# Patient Record
Sex: Male | Born: 1960 | Race: Black or African American | Hispanic: No | Marital: Single | Smoking: Current some day smoker
Health system: Southern US, Community
[De-identification: ages and names within clinical notes are randomized; demographics above are authoritative.]

## PROBLEM LIST (undated history)

## (undated) DIAGNOSIS — F1010 Alcohol abuse, uncomplicated: Secondary | ICD-10-CM

## (undated) DIAGNOSIS — I10 Essential (primary) hypertension: Secondary | ICD-10-CM

## (undated) DIAGNOSIS — H9190 Unspecified hearing loss, unspecified ear: Secondary | ICD-10-CM

## (undated) HISTORY — PX: ABDOMINAL SURGERY: SHX537

---

## 2008-04-27 ENCOUNTER — Emergency Department: Payer: Self-pay | Admitting: Unknown Physician Specialty

## 2010-08-01 ENCOUNTER — Emergency Department: Payer: Self-pay | Admitting: Emergency Medicine

## 2012-06-08 ENCOUNTER — Emergency Department: Payer: Self-pay | Admitting: Emergency Medicine

## 2012-06-09 LAB — COMPREHENSIVE METABOLIC PANEL
Alkaline Phosphatase: 139 U/L — ABNORMAL HIGH (ref 50–136)
Calcium, Total: 9.4 mg/dL (ref 8.5–10.1)
Chloride: 103 mmol/L (ref 98–107)
Co2: 28 mmol/L (ref 21–32)
Creatinine: 0.71 mg/dL (ref 0.60–1.30)
EGFR (Non-African Amer.): >60
Osmolality: 281 (ref 275–301)
Sodium: 141 mmol/L (ref 136–145)
Total Protein: 8.8 g/dL — ABNORMAL HIGH (ref 6.4–8.2)

## 2012-06-09 LAB — CBC
HCT: 45.7 % (ref 40.0–52.0)
HGB: 15.3 g/dL (ref 13.0–18.0)
MCH: 29.6 pg (ref 26.0–34.0)
MCV: 88 fL (ref 80–100)
RBC: 5.17 10*6/uL (ref 4.40–5.90)

## 2012-06-09 LAB — DRUG SCREEN, URINE
Amphetamines, Ur Screen: NEGATIVE (ref ?–1000)
Barbiturates, Ur Screen: NEGATIVE (ref ?–200)
Cannabinoid 50 Ng, Ur ~~LOC~~: POSITIVE (ref ?–50)
MDMA (Ecstasy)Ur Screen: NEGATIVE (ref ?–500)
Opiate, Ur Screen: NEGATIVE (ref ?–300)
Phencyclidine (PCP) Ur S: NEGATIVE (ref ?–25)
Tricyclic, Ur Screen: NEGATIVE (ref ?–1000)

## 2012-06-09 LAB — ETHANOL
Ethanol %: <0.003 % (ref 0.000–0.080)
Ethanol: <3 mg/dL

## 2012-06-09 LAB — TROPONIN I: Troponin-I: <0.02 ng/mL

## 2012-08-29 ENCOUNTER — Emergency Department: Payer: Self-pay | Admitting: Emergency Medicine

## 2014-07-28 ENCOUNTER — Emergency Department: Payer: Self-pay | Admitting: Emergency Medicine

## 2016-01-04 ENCOUNTER — Emergency Department
Admission: EM | Admit: 2016-01-04 | Discharge: 2016-01-04 | Disposition: A | Discharge status: Home / Self Care | Payer: Medicaid Other | Attending: Emergency Medicine | Admitting: Emergency Medicine

## 2016-01-04 DIAGNOSIS — Y9289 Other specified places as the place of occurrence of the external cause: Secondary | ICD-10-CM | POA: Diagnosis not present

## 2016-01-04 DIAGNOSIS — Z23 Encounter for immunization: Secondary | ICD-10-CM | POA: Insufficient documentation

## 2016-01-04 DIAGNOSIS — X58XXXA Exposure to other specified factors, initial encounter: Secondary | ICD-10-CM | POA: Insufficient documentation

## 2016-01-04 DIAGNOSIS — Y9389 Activity, other specified: Secondary | ICD-10-CM | POA: Insufficient documentation

## 2016-01-04 DIAGNOSIS — Y998 Other external cause status: Secondary | ICD-10-CM | POA: Diagnosis not present

## 2016-01-04 DIAGNOSIS — S61219A Laceration without foreign body of unspecified finger without damage to nail, initial encounter: Secondary | ICD-10-CM

## 2016-01-04 DIAGNOSIS — S61215A Laceration without foreign body of left ring finger without damage to nail, initial encounter: Secondary | ICD-10-CM | POA: Insufficient documentation

## 2016-01-04 MED ORDER — TETANUS-DIPHTH-ACELL PERTUSSIS 5-2.5-18.5 LF-MCG/0.5 IM SUSP
0.5000 mL | Freq: Once | INTRAMUSCULAR | Status: AC
  Administered 2016-01-04: 0.5 mL via INTRAMUSCULAR
  Filled 2016-01-04: qty 0.5

## 2016-01-04 MED ORDER — AMOXICILLIN-POT CLAVULANATE 875-125 MG PO TABS
1.0000 | ORAL_TABLET | Freq: Once | ORAL | Status: AC
  Administered 2016-01-04: 1 via ORAL
  Filled 2016-01-04: qty 1

## 2016-01-04 MED ORDER — AMOXICILLIN-POT CLAVULANATE 875-125 MG PO TABS: 1.0000 | ORAL_TABLET | Freq: Two times a day (BID) | ORAL | Status: DC

## 2016-01-04 MED ORDER — LIDOCAINE HCL (PF) 1 % IJ SOLN
5.0000 mL | Freq: Once | INTRAMUSCULAR | Status: AC
  Administered 2016-01-04: 5 mL
  Filled 2016-01-04: qty 5

## 2016-01-04 NOTE — Discharge Instructions (Signed)
Nonsutured Laceration Care °A laceration is a cut that goes through all layers of the skin and extends into the tissue that is right under the skin. This type of cut is usually stitched up (sutured) or closed with tape (adhesive strips) or skin glue shortly after the injury happens. °However, if the wound is dirty or if several hours pass before medical treatment is provided, it is likely that germs (bacteria) will enter the wound. Closing a laceration after bacteria have entered it increases the risk of infection. In these cases, your health care provider may leave the laceration open (nonsutured) and cover it with a bandage. This type of treatment helps prevent infection and allows the wound to heal from the deepest layer of tissue damage up to the surface. °An open fracture is a type of injury that may involve nonsutured lacerations. An open fracture is a break in a bone that happens along with one or more lacerations through the skin that is near the fracture site. °HOW TO CARE FOR YOUR NONSUTURED LACERATION °· Take or apply over-the-counter and prescription medicines only as told by your health care provider. °· If you were prescribed an antibiotic medicine, take or apply it as told by your health care provider. Do not stop using the antibiotic even if your condition improves. °· Clean the wound one time each day or as told by your health care provider. °¨ Wash the wound with mild soap and water. °¨ Rinse the wound with water to remove all soap. °¨ Pat your wound dry with a clean towel. Do not rub the wound. °· Do not inject anything into the wound unless your health care provider told you to. °· Change any bandages (dressings) as told by your health care provider. This includes changing the dressing if it gets wet, dirty, or starts to smell bad. °· Keep the dressing dry until your health care provider says it can be removed. Do not take baths, swim, or do anything that puts your wound underwater until your  health care provider approves. °· Raise (elevate) the injured area above the level of your heart while you are sitting or lying down, if possible. °· Do not scratch or pick at the wound. °· Check your wound every day for signs of infection. Watch for: °¨ Redness, swelling, or pain. °¨ Fluid, blood, or pus. °· Keep all follow-up visits as told by your health care provider. This is important. °SEEK MEDICAL CARE IF: °· You received a tetanus and shot and you have swelling, severe pain, redness, or bleeding at the injection site.   °· You have a fever. °· Your pain is not controlled with medicine. °· You have increased redness, swelling, or pain at the site of your wound. °· You have fluid, blood, or pus coming from your wound. °· You notice a bad smell coming from your wound or your dressing. °· You notice something coming out of the wound, such as wood or glass. °· You notice a change in the color of your skin near your wound. °· You develop a new rash. °· You need to change the dressing frequently due to fluid, blood, or pus draining from the wound. °· You develop numbness around your wound. °SEEK IMMEDIATE MEDICAL CARE IF: °· Your pain suddenly increases and is severe. °· You develop severe swelling around the wound. °· The wound is on your hand or foot and you cannot properly move a finger or toe. °· The wound is on your hand or   foot and you notice that your fingers or toes look pale or bluish.  You have a red streak going away from your wound.   This information is not intended to replace advice given to you by your health care provider. Make sure you discuss any questions you have with your health care provider.   Document Released: 09/18/2006 Document Revised: 03/07/2015 Document Reviewed: 10/17/2014 Elsevier Interactive Patient Education [employer]   Since your finger laceration happened more than 15 hours ago, it can not be safely fixed with sutures. You should take the antibiotic until gone  to prevent infection. Keep the wound clean, dry, and covered. Do not remove the tape until the wound bed is dry. You should re-apply tape to the wound as needed. Return here for wound check in a 4-7 days.

## 2016-01-04 NOTE — ED Notes (Signed)
Pt is hearing impaired and requires sign language.. Pt recently had suture to the left 4th finger and they have come out and the wound is open.. No s/sx of infection

## 2016-01-04 NOTE — ED Provider Notes (Signed)
[facility] Emergency Department Provider Note ____________________________________________  Time seen: 1227  I have reviewed the triage vital signs and the nursing notes.  HISTORY  Chief Complaint  Laceration  History limited by hearing impairment. Tele-interpreter (ASL) is used for interview and exam.   HPI Cameron Hunt  is a 55 y.o. male is into the ED for evaluation of a laceration to his left ring finger at the fat pad.Through the interpreter, the patient describes injury occurred sometime yesterday evening between the hours of 5 in the evening and midnight. He is on aware, and unable to give the exact cause of the injury. He describes that at about 5:00 he either passed out or fell asleep, and awoke noting pain to the finger. He denies any other injury at this time, and presents for treatment of this open wound to the distal fingertip. The patient has a laceration to the fat pad and the cuticle that he cannot explain. He is primarily concerned for wound dressing and keep in the finger protected until it heals. Patient is advised that due to the time since the injury, and the unclear etiology, this wound will likely not be sutured here in the ED.  No past medical history on file.  There are no active problems to display for this patient.  No past surgical history on file.  Current Outpatient Rx  Name  Route  Sig  Dispense  Refill  . amoxicillin-clavulanate (AUGMENTIN) 875-125 MG tablet   Oral   Take 1 tablet by mouth 2 (two) times daily.   19 tablet   0    Allergies Review of patient's allergies indicates not on file.  No family history on file.  Social History Social History  Substance Use Topics  . Smoking status: Not on file  . Smokeless tobacco: Not on file  . Alcohol Use: Not on file   Review of Systems  Constitutional: Negative for fever. Eyes: Negative for visual changes. ENT: Negative for sore throat. Cardiovascular: Negative for  chest pain. Respiratory: Negative for shortness of breath. Musculoskeletal: Negative for back pain. Skin: Negative for rash. Left ring finger lac as above. Neurological: Negative for headaches, focal weakness or numbness. ____________________________________________  PHYSICAL EXAM:  VITAL SIGNS: ED Triage Vitals  Enc Vitals Group     BP 01/04/16 1150 112/86 mmHg     Pulse Rate 01/04/16 1150 80     Resp 01/04/16 1150 18     Temp 01/04/16 1150 97.7 F (36.5 C)     Temp Source 01/04/16 1150 Oral     SpO2 01/04/16 1150 100 %     Weight --      Height --      Head Cir --      Peak Flow --      Pain Score --      Pain Loc --      Pain Edu? --      Excl. in GC? --    Constitutional: Alert and oriented. Well appearing and in no distress. Head: Normocephalic and atraumatic. Eyes: Conjunctivae are normal. PERRL. Normal extraocular movements Hematological/Lymphatic/Immunological: No cervical lymphadenopathy. Cardiovascular: Normal rate, regular rhythm.  Respiratory: Normal respiratory effort.  Musculoskeletal: Nontender with normal range of motion in all extremities.  Neurologic:  Normal gait without ataxia. Normal speech and language. No gross focal neurologic deficits are appreciated. Skin:  Skin is warm, dry and intact. No rash noted. Psychiatric: Mood and affect are normal. Patient exhibits appropriate insight and judgment. ____________________________________________  PROCEDURES  Augmentin 875 mg PO Tdap Finger splint   LACERATION REPAIR Performed by: Lissa Hoard Authorized by: Lissa Hoard Consent: Verbal consent obtained. Risks and benefits: risks, benefits and alternatives were discussed Consent given by: patient Patient identity confirmed: provided demographic data Prepped and Draped in normal sterile fashion Wound explored  Laceration Location: left ring finger  Laceration Length: 2 cm  No Foreign Bodies seen or palpated  Anesthesia:  digital infiltration  Local anesthetic: lidocaine 1% w/o epinephrine  Anesthetic total: 3 ml  Irrigation method: syringe Amount of cleaning: standard  Skin closure: steri-strips  Number of sutures: 4  Patient tolerance: Patient tolerated the procedure well with no immediate complications. ____________________________________________  INITIAL IMPRESSION / ASSESSMENT AND PLAN / ED COURSE  Patient with a old left finger laceration of unknown mechanism. The wound will heal by secondary intent. Patient will be discharged with wound care supplies for daily wound care. He will be supplied with a finger splint for protection. Return to the ED for wound check as needed.  ____________________________________________  FINAL CLINICAL IMPRESSION(S) / ED DIAGNOSES  Final diagnoses:  Finger laceration, initial encounter      Lissa Hoard, PA-C 01/04/16 1635  Sharman Cheek, MD 01/06/16 1555

## 2016-01-04 NOTE — ED Notes (Signed)
Pt with lac finger #4 left hand at last joint. Not bleeding at this time.

## 2016-01-04 NOTE — ED Notes (Addendum)
Call Deirdre Peer the pt neighbor when the pt is ready for discharge at [phone]

## 2017-03-09 ENCOUNTER — Encounter: Payer: Self-pay | Admitting: Emergency Medicine

## 2017-03-09 ENCOUNTER — Emergency Department
Admission: EM | Admit: 2017-03-09 | Discharge: 2017-03-09 | Disposition: A | Discharge status: Home / Self Care | Payer: Medicaid Other | Attending: Emergency Medicine | Admitting: Emergency Medicine

## 2017-03-09 DIAGNOSIS — Z79899 Other long term (current) drug therapy: Secondary | ICD-10-CM | POA: Diagnosis not present

## 2017-03-09 DIAGNOSIS — F10920 Alcohol use, unspecified with intoxication, uncomplicated: Secondary | ICD-10-CM

## 2017-03-09 DIAGNOSIS — F10120 Alcohol abuse with intoxication, uncomplicated: Secondary | ICD-10-CM | POA: Insufficient documentation

## 2017-03-09 DIAGNOSIS — F1910 Other psychoactive substance abuse, uncomplicated: Secondary | ICD-10-CM | POA: Insufficient documentation

## 2017-03-09 LAB — COMPREHENSIVE METABOLIC PANEL
ALBUMIN: 4.7 g/dL (ref 3.5–5.0)
ALK PHOS: 94 U/L (ref 38–126)
ALT: 18 U/L (ref 17–63)
ANION GAP: 10 (ref 5–15)
AST: 35 U/L (ref 15–41)
BUN: 10 mg/dL (ref 6–20)
CHLORIDE: 106 mmol/L (ref 101–111)
CO2: 26 mmol/L (ref 22–32)
Calcium: 8.6 mg/dL — ABNORMAL LOW (ref 8.9–10.3)
Creatinine, Ser: 0.67 mg/dL (ref 0.61–1.24)
GFR calc non Af Amer: >60 mL/min (ref >60)
GLUCOSE: 93 mg/dL (ref 65–99)
Potassium: 3.6 mmol/L (ref 3.5–5.1)
SODIUM: 142 mmol/L (ref 135–145)
Total Bilirubin: 0.6 mg/dL (ref 0.3–1.2)
Total Protein: 8.4 g/dL — ABNORMAL HIGH (ref 6.5–8.1)

## 2017-03-09 LAB — CBC
HCT: 37.5 % — ABNORMAL LOW (ref 40.0–52.0)
HEMOGLOBIN: 13.2 g/dL (ref 13.0–18.0)
MCH: 31.6 pg (ref 26.0–34.0)
MCHC: 35.2 g/dL (ref 32.0–36.0)
MCV: 89.8 fL (ref 80.0–100.0)
PLATELETS: 326 10*3/uL (ref 150–440)
RBC: 4.17 MIL/uL — AB (ref 4.40–5.90)
RDW: 15 % — ABNORMAL HIGH (ref 11.5–14.5)
WBC: 12.6 10*3/uL — ABNORMAL HIGH (ref 3.8–10.6)

## 2017-03-09 LAB — URINE DRUG SCREEN, QUALITATIVE (ARMC ONLY)
AMPHETAMINES, UR SCREEN: NOT DETECTED
Barbiturates, Ur Screen: NOT DETECTED
Benzodiazepine, Ur Scrn: NOT DETECTED
COCAINE METABOLITE, UR ~~LOC~~: NOT DETECTED
Cannabinoid 50 Ng, Ur ~~LOC~~: POSITIVE — AB
MDMA (ECSTASY) UR SCREEN: NOT DETECTED
METHADONE SCREEN, URINE: NOT DETECTED
OPIATE, UR SCREEN: NOT DETECTED
Phencyclidine (PCP) Ur S: NOT DETECTED
TRICYCLIC, UR SCREEN: NOT DETECTED

## 2017-03-09 LAB — ETHANOL: Alcohol, Ethyl (B): 320 mg/dL (ref <5)

## 2017-03-09 LAB — ACETAMINOPHEN LEVEL: Acetaminophen (Tylenol), Serum: <10 ug/mL — ABNORMAL LOW (ref 10–30)

## 2017-03-09 LAB — SALICYLATE LEVEL: Salicylate Lvl: <7 mg/dL (ref 2.8–30.0)

## 2017-03-09 NOTE — ED Triage Notes (Signed)
Patient presents to Emergency Department via [facility] with complaints of being found down by bystanders.  Per EMS pt is drunk, peed on self when they picked him up, pt is deaf and mainly mute, (occasional grunts and moans).  Pt doesn't appear to know ASL.  Pt communicating mostly through gestures and grunts.  Appears to be requesting food and warmth.  Pt smells of ETOH and urine.

## 2017-03-09 NOTE — ED Notes (Addendum)
ASL used by Dr Zenda AlpersWebster, interpreter and this RN  Pt reports via interpreter: no problems, [place] the [place], drank 2 x 40oz, "smoked a little weed", take no meds and isn't supposed to be taking any, denies allergies to meds, DM and HTN, goes to church and lives with ex-wife and daughter  Pt unable to give name or phone number of anyone to pick him up  Pt given warm blanket and food and drink

## 2017-03-09 NOTE — ED Notes (Signed)
Meal tray provided to pt. NAD at this time.

## 2017-03-09 NOTE — ED Notes (Signed)
Pt communicates with this RN by writing and denies any problems. Pt reports he has been drinking tonight but does not wish to hurt himself or anyone else. Will have pt interviewed by [employer] Interpreter for [employer].

## 2017-03-09 NOTE — ED Provider Notes (Signed)
-----------------------------------------   10:06 AM on 03/09/2017 -----------------------------------------  Patient appears well, is awake, has eaten without difficulty. Is currently sitting in the hallway, no distress reading a newspaper. Patient appears clinically sober. We will discharge the patient home.   [name], Elbia Paro, MD 03/09/17 1006

## 2017-03-09 NOTE — ED Provider Notes (Signed)
[facility] Emergency Department Provider Note   ____________________________________________   First MD Initiated Contact with Patient 03/09/17 0107     (approximate)  I have reviewed the triage vital signs and the nursing notes.   HISTORY  Chief Complaint Alcohol Intoxication    HPI Cameron Hunt is a 56 y.o. male who comes into the hospital today after being [place] laying outside on the track by EMS. They report that he seems intoxicated. The patient is hearing impaired. When they arrived he was unresponsive but he is aroused now. Using a sign language interpreter we discovered that the patient denies falling or hitting his head. He reports that he was just really tired which is why he laid down.He reports that he was just drinking some tonight. The patient states that he lives with his wife and child. He also reports that they are lying but will not state what they are. The patient denies any suicidal or homicidal ideation. He also denies any significant pain or injury. The patient reports that he has back pain but that is something that is chronic and tends to come and go. The patient was brought into the hospital tonight for evaluation.   History reviewed. No pertinent past medical history.  There are no active problems to display for this patient.   History reviewed. No pertinent surgical history.  Prior to Admission medications   Medication Sig Start Date End Date Taking? Authorizing Provider  amoxicillin-clavulanate (AUGMENTIN) 875-125 MG tablet Take 1 tablet by mouth 2 (two) times daily. 01/04/16   Menshew, Charlesetta Ivory, PA-C    Allergies Patient has no known allergies.  History reviewed. No pertinent family history.  Social History Social History  Substance Use Topics  . Smoking status: Unknown If Ever Smoked  . Smokeless tobacco: Not on file  . Alcohol use Yes    Review of Systems  Constitutional: No fever/chills Eyes: No visual  changes. ENT: No sore throat. Cardiovascular: Denies chest pain. Respiratory: Denies shortness of breath. Gastrointestinal: No abdominal pain.  No nausea, no vomiting.  No diarrhea.  No constipation. Genitourinary: Negative for dysuria. Musculoskeletal:  back pain. Skin: Negative for rash. Neurological: Negative for headaches, focal weakness or numbness.   ____________________________________________   PHYSICAL EXAM:  VITAL SIGNS: ED Triage Vitals  Enc Vitals Group     BP 03/09/17 0209 129/89     Pulse Rate 03/09/17 0209 83     Resp 03/09/17 0209 18     Temp 03/09/17 0209 97.8 F (36.6 C)     Temp Source 03/09/17 0209 Oral     SpO2 03/09/17 0209 100 %     Weight 03/09/17 0210 150 lb (68 kg)     Height 03/09/17 0210 6\' 1"  (1.854 m)     Head Circumference --      Peak Flow --      Pain Score --      Pain Loc --      Pain Edu? --      Excl. in GC? --     Constitutional: Alert and oriented. Well appearing and in no acute distress. Eyes: Conjunctivae are normal. PERRL. EOMI. Head: Atraumatic. Nose: No congestion/rhinnorhea. Mouth/Throat: Mucous membranes are moist.  Oropharynx non-erythematous. Neck: No cervical spine tenderness to palpation. Cardiovascular: Normal rate, regular rhythm. Grossly normal heart sounds.  Good peripheral circulation. Respiratory: Normal respiratory effort.  No retractions. Lungs CTAB. Gastrointestinal: Soft and nontender. No distention. Positive bowel sounds Musculoskeletal: No lower extremity tenderness nor  edema.   Neurologic:  Normal speech and language.  Skin:  Skin is warm, dry and intact. Marland Kitchen. Psychiatric: Mood and affect are normal.   ____________________________________________   LABS (all labs ordered are listed, but only abnormal results are displayed)  Labs Reviewed  CBC - Abnormal; Notable for the following:       Result Value   WBC 12.6 (*)    RBC 4.17 (*)    HCT 37.5 (*)    RDW 15.0 (*)    All other components within  normal limits  COMPREHENSIVE METABOLIC PANEL - Abnormal; Notable for the following:    Calcium 8.6 (*)    Total Protein 8.4 (*)    All other components within normal limits  ETHANOL - Abnormal; Notable for the following:    Alcohol, Ethyl (B) 320 (*)    All other components within normal limits  URINE DRUG SCREEN, QUALITATIVE ([facility] ONLY) - Abnormal; Notable for the following:    Cannabinoid 50 Ng, Ur [place] POSITIVE (*)    All other components within normal limits  ACETAMINOPHEN LEVEL - Abnormal; Notable for the following:    Acetaminophen (Tylenol), Serum <10 (*)    All other components within normal limits  SALICYLATE LEVEL   ____________________________________________  EKG  none ____________________________________________  RADIOLOGY  none ____________________________________________   PROCEDURES  Procedure(s) performed: None  Procedures  Critical Care performed: No  ____________________________________________   INITIAL IMPRESSION / ASSESSMENT AND PLAN / ED COURSE  Pertinent labs & imaging results that were available during my care of the patient were reviewed by me and considered in my medical decision making (see chart for details).  This is a 56 year old male who comes into the hospital today after being found unresponsive on a truck. The patient is acutely intoxicated. His alcohol level is 320. The patient also has been smoking marijuana. We will monitor the patient until he sobers up.    The patient's care will be signed out to Dr.Paduchowski who will disposition the patient  ____________________________________________   FINAL CLINICAL IMPRESSION(S) / ED DIAGNOSES  Final diagnoses:  Alcoholic intoxication without complication (HCC)  Substance abuse      NEW MEDICATIONS STARTED DURING THIS VISIT:  New Prescriptions   No medications on file     Note:  This document was prepared using Dragon voice recognition software and may include  unintentional dictation errors.    Rebecka ApleyWebster, Allison P, MD 03/09/17 [phone]

## 2017-03-09 NOTE — ED Notes (Signed)

## 2017-03-09 NOTE — ED Notes (Signed)
BEHAVIORAL HEALTH ROUNDING Patient sleeping: Yes.   Patient alert and oriented: not applicable SLEEPING Behavior appropriate: Yes.  ; If no, describe: SLEEPING Nutrition and fluids offered: No SLEEPING Toileting and hygiene offered: NoSLEEPING Sitter present: not applicable, Q 15 min safety rounds and observation. Law enforcement present: Yes ODS 

## 2017-03-09 NOTE — ED Notes (Signed)
CRITICAL LAB: ETOH is 320, [facility], Dr. Zenda AlpersWebster notified, orders received

## 2017-03-09 NOTE — ED Notes (Signed)
This RN took pt to [employer] [facility] shower, soap, water and deodorant used, pt very unsteady, pt appears appreciative, linen changed, new clothes (burgundy scrubs) given, pt returned to hallway for triage, urine soaked clothes in pt's belongings bag

## 2017-03-09 NOTE — ED Notes (Signed)
BEHAVIORAL HEALTH ROUNDING  Patient sleeping: No.  Patient alert and oriented: yes  Behavior appropriate: Yes. ; If no, describe:  Nutrition and fluids offered: Yes  Toileting and hygiene offered: Yes  Sitter present: not applicable, Q 15 min safety rounds and observation.  Law enforcement present: Yes ODS  

## 2017-05-20 ENCOUNTER — Emergency Department: Payer: Medicaid Other

## 2017-05-20 ENCOUNTER — Encounter: Payer: Self-pay | Admitting: Emergency Medicine

## 2017-05-20 ENCOUNTER — Emergency Department
Admission: EM | Admit: 2017-05-20 | Discharge: 2017-05-20 | Disposition: A | Discharge status: Home / Self Care | Payer: Medicaid Other | Attending: Emergency Medicine | Admitting: Emergency Medicine

## 2017-05-20 DIAGNOSIS — G5691 Unspecified mononeuropathy of right upper limb: Secondary | ICD-10-CM | POA: Insufficient documentation

## 2017-05-20 DIAGNOSIS — F17210 Nicotine dependence, cigarettes, uncomplicated: Secondary | ICD-10-CM | POA: Insufficient documentation

## 2017-05-20 DIAGNOSIS — M79641 Pain in right hand: Secondary | ICD-10-CM | POA: Diagnosis present

## 2017-05-20 MED ORDER — METHYLPREDNISOLONE 4 MG PO TBPK: ORAL_TABLET | ORAL | 0 refills | Status: DC

## 2017-05-20 NOTE — ED Provider Notes (Signed)
[facility] Emergency Department Provider Note   ____________________________________________   First MD Initiated Contact with Patient 05/20/17 1242     (approximate)  I have reviewed the triage vital signs and the nursing notes.   HISTORY  Chief Complaint Hand Pain    HPI Cameron Hunt is a 56 y.o. male patient complain of right hand pain for 2-3 weeks. Patient unsure of injury. Patient states pain starts at the wrist and migrates to the hand. Patient is right-hand dominant. Denies loss of sensation.Patient rates pain as 7/10. Patient described a pain as "dull". No palliative measures for complaint.  History reviewed. No pertinent past medical history.  There are no active problems to display for this patient.   History reviewed. No pertinent surgical history.  Prior to Admission medications   Medication Sig Start Date End Date Taking? Authorizing Provider  amoxicillin-clavulanate (AUGMENTIN) 875-125 MG tablet Take 1 tablet by mouth 2 (two) times daily. Patient not taking: Reported on 03/09/2017 01/04/16   Menshew, Charlesetta Ivory, PA-C  methylPREDNISolone (MEDROL DOSEPAK) 4 MG TBPK tablet Take Tapered dose as directed 05/20/17   Joni Reining, PA-C    Allergies Patient has no known allergies.  No family history on file.  Social History Social History  Substance Use Topics  . Smoking status: Current Some Day Smoker  . Smokeless tobacco: Never Used  . Alcohol use Yes    Review of Systems Constitutional: No fever/chills Eyes: No visual changes. ENT: No sore throat. Hearing loss Cardiovascular: Denies chest pain. Respiratory: Denies shortness of breath. Gastrointestinal: No abdominal pain.  No nausea, no vomiting.  No diarrhea.  No constipation. Genitourinary: Negative for dysuria. Musculoskeletal: Negative for back pain. Skin: Negative for rash. Neurological: Negative for headaches, focal weakness or numbness. Psychiatric:EtOH  abuse ____________________________________________   PHYSICAL EXAM:  VITAL SIGNS: ED Triage Vitals  Enc Vitals Group     BP 05/20/17 1237 133/89     Pulse Rate 05/20/17 1237 85     Resp 05/20/17 1237 20     Temp 05/20/17 1237 98.2 F (36.8 C)     Temp Source 05/20/17 1237 Oral     SpO2 05/20/17 1237 100 %     Weight 05/20/17 1237 139 lb (63 kg)     Height 05/20/17 1237 6' (1.829 m)     Head Circumference --      Peak Flow --      Pain Score 05/20/17 1236 7     Pain Loc --      Pain Edu? --      Excl. in GC? --     Constitutional: Alert and oriented. Well appearing and in no acute distress. Cardiovascular: Normal rate, regular rhythm. Grossly normal heart sounds.  Good peripheral circulation. Respiratory: Normal respiratory effort.  No retractions. Lungs CTAB. Gastrointestinal: Soft and nontender. No distention. No abdominal bruits. No CVA tenderness. Musculoskeletal:No obvious deformity edema or erythema to the right hand. Patient has no guarding palpation of the wrist and hand. Patient has full nuchal range of motion. Patient grip strength is a 3/5 compared to left nondominant extremity.  Neurologic:  Normal speech and language. No gross focal neurologic deficits are appreciated. No gait instability. Skin:  Skin is warm, dry and intact. No rash noted. Psychiatric: Mood and affect are normal. Speech and behavior are normal.  ____________________________________________   LABS (all labs ordered are listed, but only abnormal results are displayed)  Labs Reviewed - No data to display ____________________________________________  EKG  ____________________________________________  RADIOLOGY  Dg Hand Complete Right  Result Date: 05/20/2017 CLINICAL DATA:  RIGHT hand pain for 3 weeks, uncertain injury, dorsal pain, weakened grip EXAM: RIGHT HAND - COMPLETE 3+ VIEW COMPARISON:  None FINDINGS: Osseous mineralization normal. Joint spaces preserved. Small non fused ossicle  at dorsal margin of the radiocarpal joint, appears corticated and old. No fracture, dislocation, or bone destruction. IMPRESSION: No acute abnormalities. Electronically Signed   By: Ulyses SouthwardMark  Boles M.D.   On: 05/20/2017 12:58    ____________________________________________   PROCEDURES  Procedure(s) performed: None  Procedures  Critical Care performed: No  ____________________________________________   INITIAL IMPRESSION / ASSESSMENT AND PLAN / ED COURSE  Pertinent labs & imaging results that were available during my care of the patient were reviewed by me and considered in my medical decision making (see chart for details).  Neuropathy of the right hand. Discussed x-ray finding with patient. Patient placed in a Velcro wrist splint. [facility] to [facility]y.      ____________________________________________   FINAL CLINICAL IMPRESSION(S) / ED DIAGNOSES  Final diagnoses:  Neuropathy of right hand      NEW MEDICATIONS STARTED DURING THIS VISIT:  New Prescriptions   METHYLPREDNISOLONE (MEDROL DOSEPAK) 4 MG TBPK TABLET    Take Tapered dose as directed     Note:  This document was prepared using Dragon voice recognition software and may include unintentional dictation errors.    Joni ReiningSmith, Jem Castro K, PA-C 05/20/17 1322    Rockne MenghiniNorman, Anne-Caroline, MD 05/20/17 [phone]

## 2017-05-20 NOTE — Discharge Instructions (Signed)
Wear splint until evaluation by a neurologist

## 2017-05-20 NOTE — ED Triage Notes (Signed)
Presents with pain to right hand for about 3 weeks  Unsure of injury states pain is mainly at wrist and moves into hand  Right hand grip is weaker that the right  Positive pulses

## 2018-04-10 ENCOUNTER — Emergency Department: Payer: Medicaid Other

## 2018-04-10 ENCOUNTER — Encounter: Payer: Self-pay | Admitting: Emergency Medicine

## 2018-04-10 DIAGNOSIS — Z5321 Procedure and treatment not carried out due to patient leaving prior to being seen by health care provider: Secondary | ICD-10-CM | POA: Diagnosis not present

## 2018-04-10 DIAGNOSIS — Y929 Unspecified place or not applicable: Secondary | ICD-10-CM | POA: Diagnosis not present

## 2018-04-10 DIAGNOSIS — Y998 Other external cause status: Secondary | ICD-10-CM | POA: Diagnosis not present

## 2018-04-10 DIAGNOSIS — W0110XA Fall on same level from slipping, tripping and stumbling with subsequent striking against unspecified object, initial encounter: Secondary | ICD-10-CM | POA: Diagnosis not present

## 2018-04-10 DIAGNOSIS — S0990XA Unspecified injury of head, initial encounter: Secondary | ICD-10-CM | POA: Insufficient documentation

## 2018-04-10 DIAGNOSIS — Y939 Activity, unspecified: Secondary | ICD-10-CM | POA: Diagnosis not present

## 2018-04-10 NOTE — ED Triage Notes (Signed)
Patient tripped and fell. Patient hit his head. Patient with abrasions to forehead. Patient +LOC. Patient denies any other injury.

## 2018-04-11 ENCOUNTER — Emergency Department
Admission: EM | Admit: 2018-04-11 | Discharge: 2018-04-11 | Disposition: A | Discharge status: Home / Self Care | Payer: Medicaid Other | Attending: Emergency Medicine | Admitting: Emergency Medicine

## 2018-04-11 HISTORY — DX: Essential (primary) hypertension: I10

## 2018-04-13 ENCOUNTER — Telehealth: Payer: Self-pay | Admitting: Emergency Medicine

## 2018-04-13 NOTE — Telephone Encounter (Signed)
Called patient due to lwot to inquire about condition and follow up plans. Left message with person who answered.  She says he is not around right now.

## 2018-05-25 ENCOUNTER — Other Ambulatory Visit: Payer: Self-pay

## 2018-05-25 ENCOUNTER — Emergency Department
Admission: EM | Admit: 2018-05-25 | Discharge: 2018-05-26 | Disposition: A | Discharge status: Home / Self Care | Payer: Medicaid Other | Attending: Emergency Medicine | Admitting: Emergency Medicine

## 2018-05-25 DIAGNOSIS — F1010 Alcohol abuse, uncomplicated: Secondary | ICD-10-CM

## 2018-05-25 DIAGNOSIS — F17200 Nicotine dependence, unspecified, uncomplicated: Secondary | ICD-10-CM | POA: Insufficient documentation

## 2018-05-25 DIAGNOSIS — Y939 Activity, unspecified: Secondary | ICD-10-CM | POA: Insufficient documentation

## 2018-05-25 DIAGNOSIS — S0081XA Abrasion of other part of head, initial encounter: Secondary | ICD-10-CM | POA: Insufficient documentation

## 2018-05-25 DIAGNOSIS — I10 Essential (primary) hypertension: Secondary | ICD-10-CM | POA: Insufficient documentation

## 2018-05-25 DIAGNOSIS — F10950 Alcohol use, unspecified with alcohol-induced psychotic disorder with delusions: Secondary | ICD-10-CM | POA: Diagnosis not present

## 2018-05-25 DIAGNOSIS — W010XXA Fall on same level from slipping, tripping and stumbling without subsequent striking against object, initial encounter: Secondary | ICD-10-CM | POA: Insufficient documentation

## 2018-05-25 DIAGNOSIS — Y999 Unspecified external cause status: Secondary | ICD-10-CM | POA: Insufficient documentation

## 2018-05-25 DIAGNOSIS — Y929 Unspecified place or not applicable: Secondary | ICD-10-CM | POA: Diagnosis not present

## 2018-05-25 DIAGNOSIS — F10920 Alcohol use, unspecified with intoxication, uncomplicated: Secondary | ICD-10-CM

## 2018-05-25 DIAGNOSIS — F10220 Alcohol dependence with intoxication, uncomplicated: Secondary | ICD-10-CM | POA: Insufficient documentation

## 2018-05-25 DIAGNOSIS — H9190 Unspecified hearing loss, unspecified ear: Secondary | ICD-10-CM

## 2018-05-25 DIAGNOSIS — F10959 Alcohol use, unspecified with alcohol-induced psychotic disorder, unspecified: Secondary | ICD-10-CM

## 2018-05-25 HISTORY — DX: Unspecified hearing loss, unspecified ear: H91.90

## 2018-05-25 LAB — ACETAMINOPHEN LEVEL

## 2018-05-25 LAB — COMPREHENSIVE METABOLIC PANEL
ALK PHOS: 95 U/L (ref 38–126)
ALT: 26 U/L (ref 0–44)
ANION GAP: 8 (ref 5–15)
AST: 33 U/L (ref 15–41)
Albumin: 4.5 g/dL (ref 3.5–5.0)
BUN: 14 mg/dL (ref 6–20)
CALCIUM: 8.8 mg/dL — AB (ref 8.9–10.3)
CO2: 25 mmol/L (ref 22–32)
Chloride: 107 mmol/L (ref 98–111)
Creatinine, Ser: 0.75 mg/dL (ref 0.61–1.24)
GFR calc non Af Amer: >60 mL/min (ref >60)
Glucose, Bld: 104 mg/dL — ABNORMAL HIGH (ref 70–99)
Potassium: 3.5 mmol/L (ref 3.5–5.1)
SODIUM: 140 mmol/L (ref 135–145)
TOTAL PROTEIN: 8.6 g/dL — AB (ref 6.5–8.1)
Total Bilirubin: 0.6 mg/dL (ref 0.3–1.2)

## 2018-05-25 LAB — CBC
HCT: 33.5 % — ABNORMAL LOW (ref 40.0–52.0)
Hemoglobin: 12.2 g/dL — ABNORMAL LOW (ref 13.0–18.0)
MCH: 32.9 pg (ref 26.0–34.0)
MCHC: 36.3 g/dL — ABNORMAL HIGH (ref 32.0–36.0)
MCV: 90.7 fL (ref 80.0–100.0)
PLATELETS: 310 10*3/uL (ref 150–440)
RBC: 3.69 MIL/uL — AB (ref 4.40–5.90)
RDW: 15.1 % — AB (ref 11.5–14.5)
WBC: 13 10*3/uL — AB (ref 3.8–10.6)

## 2018-05-25 LAB — ETHANOL: Alcohol, Ethyl (B): 253 mg/dL — ABNORMAL HIGH (ref <10)

## 2018-05-25 LAB — SALICYLATE LEVEL: Salicylate Lvl: <7 mg/dL (ref 2.8–30.0)

## 2018-05-25 MED ORDER — SODIUM CHLORIDE 0.9 % IV BOLUS
1000.0000 mL | Freq: Once | INTRAVENOUS | Status: AC
  Administered 2018-05-26: 1000 mL via INTRAVENOUS

## 2018-05-25 MED ORDER — THIAMINE HCL 100 MG/ML IJ SOLN
Freq: Once | INTRAVENOUS | Status: AC
  Administered 2018-05-26: 01:00:00 via INTRAVENOUS
  Filled 2018-05-25: qty 1000

## 2018-05-25 NOTE — ED Notes (Signed)
ED Provider at bedside. 
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
## 2018-05-25 NOTE — ED Triage Notes (Addendum)
[facility] [place] [facility] [place] [facility] [place] for "wandering around, falling down intoxicated" and for "being a danger to himself'. According to the officer, pt is deaf. Pt shakes his head "no" when asked if he has any thoughts of hurting himself or others.

## 2018-05-25 NOTE — ED Notes (Addendum)
1 maroon-colored t-shirt, 1 pr black shoes, 1 pr wool socks, 1 pr jean shorts, 1 black belt, 1 pr plaid boxers, 1 green bank/debit card, 1 pr white boxers. Belongings bagged and labeled per policy.

## 2018-05-26 ENCOUNTER — Emergency Department: Payer: Medicaid Other

## 2018-05-26 DIAGNOSIS — H9190 Unspecified hearing loss, unspecified ear: Secondary | ICD-10-CM

## 2018-05-26 DIAGNOSIS — F10959 Alcohol use, unspecified with alcohol-induced psychotic disorder, unspecified: Secondary | ICD-10-CM

## 2018-05-26 DIAGNOSIS — F10950 Alcohol use, unspecified with alcohol-induced psychotic disorder with delusions: Secondary | ICD-10-CM

## 2018-05-26 DIAGNOSIS — F1010 Alcohol abuse, uncomplicated: Secondary | ICD-10-CM
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
## 2018-05-26 NOTE — ED Notes (Addendum)
Patient assigned to appropriate care area   Introduced self to pt  Patient oriented to unit/care area:  Environment secured Patient is deaf, spoke slowly he is able to read lips

## 2018-05-26 NOTE — ED Notes (Signed)
TTS at bedside. 

## 2018-05-26 NOTE — ED Notes (Signed)
BEHAVIORAL HEALTH ROUNDING Patient sleeping: No. Patient alert and oriented: yes Behavior appropriate: Yes.  ; If no, describe:  Nutrition and fluids offered: yes Toileting and hygiene offered: Yes  Sitter present: q15 minute observations and security monitoring Law enforcement present: Yes    

## 2018-05-26 NOTE — ED Provider Notes (Signed)
-----------------------------------------   4:15 PM on 05/26/2018 -----------------------------------------   Height 6\' 1"  (1.854 m), weight 68 kg (150 lb).  Patient has been evaluated by psychiatry and cleared for discharge. IVC lifted by Dr. Toni Amendlapacs. Patient's labs have been reviewed with no acute findings. Patient will be discharged at this time to home    Don PerkingVeronese, [place], MD 05/26/18 1616

## 2018-05-26 NOTE — ED Notes (Signed)
Patient eating dinner.
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
## 2018-05-26 NOTE — Discharge Instructions (Addendum)
You have been seen in the Emergency Department (ED)  today for a psychiatric complaint.  You have been evaluated by psychiatry and we believe you are safe to be discharged from the hospital.   ° °Please return to the Emergency Department (ED)  immediately if you have ANY thoughts of hurting yourself or anyone else, so that we may help you. ° °Please avoid alcohol and drug use. ° °Follow up with your doctor and/or therapist as soon as possible regarding today's ED  visit.  ° °You may call crisis hotline for [place] at [phone]. ° °

## 2018-05-26 NOTE — ED Notes (Signed)
Patient discharged home, patient received discharge papers. Patient received belongings and agreed he has received all of his belongings. Patient appropriate and cooperative, Denies SI/HI AVH. Vital signs taken. NAD noted. Patient received a bus pass to catch the bus.

## 2018-05-26 NOTE — ED Notes (Signed)
Patient transported to CT escorted by police and tech.

## 2018-05-26 NOTE — ED Notes (Signed)
Patient back from CT.

## 2018-05-26 NOTE — ED Notes (Signed)
Pt given lunch tray.

## 2018-05-26 NOTE — ED Notes (Signed)
Gave patient peanut butter  And crackers,and sprite

## 2018-06-16 ENCOUNTER — Other Ambulatory Visit: Payer: Self-pay

## 2018-06-16 ENCOUNTER — Emergency Department
Admission: EM | Admit: 2018-06-16 | Discharge: 2018-06-16 | Disposition: A | Discharge status: Home / Self Care | Payer: Medicaid Other | Attending: Emergency Medicine | Admitting: Emergency Medicine

## 2018-06-16 ENCOUNTER — Encounter: Payer: Self-pay | Admitting: Emergency Medicine

## 2018-06-16 DIAGNOSIS — I10 Essential (primary) hypertension: Secondary | ICD-10-CM | POA: Insufficient documentation

## 2018-06-16 DIAGNOSIS — F10220 Alcohol dependence with intoxication, uncomplicated: Secondary | ICD-10-CM | POA: Insufficient documentation

## 2018-06-16 DIAGNOSIS — F17200 Nicotine dependence, unspecified, uncomplicated: Secondary | ICD-10-CM | POA: Insufficient documentation

## 2018-06-16 DIAGNOSIS — F10920 Alcohol use, unspecified with intoxication, uncomplicated: Secondary | ICD-10-CM

## 2018-06-16 LAB — BASIC METABOLIC PANEL
Anion gap: 9 (ref 5–15)
BUN: 12 mg/dL (ref 6–20)
CO2: 30 mmol/L (ref 22–32)
Calcium: 8.9 mg/dL (ref 8.9–10.3)
Chloride: 102 mmol/L (ref 98–111)
Creatinine, Ser: 0.83 mg/dL (ref 0.61–1.24)
GFR calc Af Amer: >60 mL/min (ref >60)
GLUCOSE: 94 mg/dL (ref 70–99)
POTASSIUM: 3.8 mmol/L (ref 3.5–5.1)
Sodium: 141 mmol/L (ref 135–145)

## 2018-06-16 LAB — CBC WITH DIFFERENTIAL/PLATELET
BASOS ABS: 0.2 10*3/uL — AB (ref 0–0.1)
Basophils Relative: 2 %
EOS PCT: 1 %
Eosinophils Absolute: 0.1 10*3/uL (ref 0–0.7)
HCT: 38.7 % — ABNORMAL LOW (ref 40.0–52.0)
Hemoglobin: 13.6 g/dL (ref 13.0–18.0)
LYMPHS PCT: 34 %
Lymphs Abs: 3.4 10*3/uL (ref 1.0–3.6)
MCH: 32.8 pg (ref 26.0–34.0)
MCHC: 35.1 g/dL (ref 32.0–36.0)
MCV: 93.6 fL (ref 80.0–100.0)
MONO ABS: 0.8 10*3/uL (ref 0.2–1.0)
MONOS PCT: 8 %
Neutro Abs: 5.5 10*3/uL (ref 1.4–6.5)
Neutrophils Relative %: 55 %
PLATELETS: 361 10*3/uL (ref 150–440)
RBC: 4.13 MIL/uL — ABNORMAL LOW (ref 4.40–5.90)
RDW: 15.4 % — AB (ref 11.5–14.5)
WBC: 10.1 10*3/uL (ref 3.8–10.6)

## 2018-06-16 LAB — ETHANOL: Alcohol, Ethyl (B): 223 mg/dL — ABNORMAL HIGH (ref <10)

## 2018-06-16 NOTE — ED Notes (Signed)
Pt sleeping again but continues to be easily arousable. Pt able to sign with the tele interpreter without difficulty and other than a mild headache pt denies medical complaint at this time and reports feeling as though he has sobered up a little more. RN provided a remote and pt remains in stretcher with call bell in reach in NAD.

## 2018-06-16 NOTE — ED Notes (Signed)
Pt watching TV. RN asked if patient was ready to be discharged to catch the bus at 1140. Pt reports that will be okay with him. Pt requesting $10 because he needs food when he leaves. RN offered a second meal tray but told pt we could not provide 10 dollars. Pt reports he is okay and did not request a second meal tray.

## 2018-06-16 NOTE — Discharge Instructions (Addendum)
Please cut down on your alcohol intake.  If you need help with that Alcoholics Anonymous is probably the best that we have to offer.  Please return here for any further problems.  Follow-up with your doctor for your regular medical care or see the [facility], the [facility], the [facility], the [facility] or [facility] care.

## 2018-06-16 NOTE — ED Provider Notes (Signed)
Patient now appears to be clinically sober.  He says he feels much better.  We will discharge him.   Arnaldo NatalMalinda, Merisa Julio F, MD 06/16/18 [phone]
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
## 2018-06-16 NOTE — ED Notes (Signed)
Pt resting comfortably no change in condition

## 2018-06-16 NOTE — ED Notes (Signed)
Pt resting with eyes closed.
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
## 2018-06-16 NOTE — ED Notes (Signed)
Pt easily arousable. Meal tray provided. Pt in NAD.

## 2018-06-16 NOTE — ED Notes (Signed)
RN called [facility] [employer] to take pt to the bus stop. Pt reports understanding of plan and is able to ambulate to cab. Pt given food and information on medication management and resources in the area. Pt in NAD.

## 2018-10-20 ENCOUNTER — Emergency Department: Payer: Medicaid Other

## 2018-10-20 ENCOUNTER — Encounter: Payer: Self-pay | Admitting: Emergency Medicine

## 2018-10-20 DIAGNOSIS — Y9302 Activity, running: Secondary | ICD-10-CM | POA: Diagnosis not present

## 2018-10-20 DIAGNOSIS — Y929 Unspecified place or not applicable: Secondary | ICD-10-CM | POA: Diagnosis not present

## 2018-10-20 DIAGNOSIS — S9032XA Contusion of left foot, initial encounter: Secondary | ICD-10-CM | POA: Diagnosis not present

## 2018-10-20 DIAGNOSIS — S99922A Unspecified injury of left foot, initial encounter: Secondary | ICD-10-CM | POA: Diagnosis present

## 2018-10-20 DIAGNOSIS — F17200 Nicotine dependence, unspecified, uncomplicated: Secondary | ICD-10-CM | POA: Diagnosis not present

## 2018-10-20 DIAGNOSIS — I10 Essential (primary) hypertension: Secondary | ICD-10-CM | POA: Insufficient documentation

## 2018-10-20 DIAGNOSIS — Y998 Other external cause status: Secondary | ICD-10-CM | POA: Diagnosis not present
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
## 2018-10-21 ENCOUNTER — Emergency Department
Admission: EM | Admit: 2018-10-21 | Discharge: 2018-10-21 | Disposition: A | Discharge status: Home / Self Care | Payer: Medicaid Other | Attending: Emergency Medicine | Admitting: Emergency Medicine

## 2018-10-21 DIAGNOSIS — S9032XA Contusion of left foot, initial encounter: Secondary | ICD-10-CM

## 2018-10-21 MED ORDER — IBUPROFEN 100 MG/5ML PO SUSP: 800.0000 mg | Freq: Once | ORAL | Status: DC

## 2018-10-21 MED ORDER — IBUPROFEN 800 MG PO TABS
800.0000 mg | ORAL_TABLET | Freq: Once | ORAL | Status: AC
  Administered 2018-10-21: 800 mg via ORAL

## 2018-10-21 MED ORDER — IBUPROFEN 800 MG PO TABS
ORAL_TABLET | ORAL | Status: AC
  Filled 2018-10-21: qty 1
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
## 2018-11-28 ENCOUNTER — Emergency Department
Admission: EM | Admit: 2018-11-28 | Discharge: 2018-11-28 | Disposition: A | Discharge status: Home / Self Care | Payer: Medicaid Other | Attending: Emergency Medicine | Admitting: Emergency Medicine

## 2018-11-28 ENCOUNTER — Other Ambulatory Visit: Payer: Self-pay

## 2018-11-28 DIAGNOSIS — I10 Essential (primary) hypertension: Secondary | ICD-10-CM | POA: Diagnosis not present

## 2018-11-28 DIAGNOSIS — M65271 Calcific tendinitis, right ankle and foot: Secondary | ICD-10-CM | POA: Diagnosis not present

## 2018-11-28 DIAGNOSIS — F17210 Nicotine dependence, cigarettes, uncomplicated: Secondary | ICD-10-CM | POA: Insufficient documentation

## 2018-11-28 DIAGNOSIS — B353 Tinea pedis: Secondary | ICD-10-CM | POA: Insufficient documentation

## 2018-11-28 DIAGNOSIS — M7750 Other enthesopathy of unspecified foot: Secondary | ICD-10-CM

## 2018-11-28 DIAGNOSIS — M79671 Pain in right foot: Secondary | ICD-10-CM | POA: Diagnosis present

## 2018-11-28 MED ORDER — FLUCONAZOLE 150 MG PO TABS: 150.0000 mg | ORAL_TABLET | ORAL | 0 refills | Status: AC

## 2018-11-28 MED ORDER — AMLODIPINE BESYLATE 5 MG PO TABS: 5.0000 mg | ORAL_TABLET | Freq: Every day | ORAL | 2 refills | Status: DC

## 2018-11-28 MED ORDER — MELOXICAM 7.5 MG PO TABS
15.0000 mg | ORAL_TABLET | Freq: Once | ORAL | Status: AC
  Administered 2018-11-28: 15 mg via ORAL
  Filled 2018-11-28: qty 2

## 2018-11-28 MED ORDER — MELOXICAM 15 MG PO TABS: 15.0000 mg | ORAL_TABLET | Freq: Every day | ORAL | 0 refills | Status: AC

## 2018-11-28 MED ORDER — AMLODIPINE BESYLATE 5 MG PO TABS
5.0000 mg | ORAL_TABLET | Freq: Once | ORAL | Status: AC
  Administered 2018-11-28: 5 mg via ORAL
  Filled 2018-11-28: qty 1

## 2018-11-28 NOTE — ED Notes (Signed)
Pt offered a sign language interpreter by Christiane Ha, pa, per jonathan pt perferred to converse by writing on paper. Information for assessment and discharge obtained via writing on paper with pt. Discussed prescriptions and discharge information with pt via writing, pt verbalizes understanding and denies further questions.

## 2018-11-28 NOTE — ED Triage Notes (Addendum)
Patient to ED via EMS for left foot pain.  Patient is deaf and does not use sign language well.

## 2018-11-28 NOTE — ED Provider Notes (Signed)
[facility] Emergency Department Provider Note  ____________________________________________  Time seen: Approximately 10:01 PM  I have reviewed the triage vital signs and the nursing notes.   HISTORY  Chief Complaint Foot Pain  Patient is deaf, unable to use sign language.  Patient is communicated with by writing on a clipboard.  HPI Cameron Hunt is a 58 y.o. male who presents the emergency department complaining of right foot pain.  Patient reports that this is been ongoing x4 weeks.  He denies any trauma to the area.  He denies any swelling, erythema.  Patient reports that it hurts along the metatarsal of the dorsal aspect of the right foot.  Patient denies any other complaints.  He denies any medication for his complaint prior to arrival.  Patient has a history of hypertension but does not take any medication on a regular basis for his for his hypertension.  He is requesting a medication for his hypertension at this time.    Past Medical History:  Diagnosis Date  . Deaf   . Hypertension     Patient Active Problem List   Diagnosis Date Noted  . 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 Alcohol abuse 05/26/2018  . Deaf 05/26/2018    No past surgical history on file.  Prior to Admission medications   Medication Sig Start Date End Date Taking? Authorizing Provider  amLODipine (NORVASC) 5 MG tablet Take 1 tablet (5 mg total) by mouth daily. 11/28/18 11/28/19  Teresa Lemmerman, Delorise RoyalsJonathan D, PA-C  fluconazole (DIFLUCAN) 150 MG tablet Take 1 tablet (150 mg total) by mouth once a week. 11/28/18   Hanan Moen, Delorise RoyalsJonathan D, PA-C  meloxicam (MOBIC) 15 MG tablet Take 1 tablet (15 mg total) by mouth daily. 11/28/18   Vung Kush, Delorise RoyalsJonathan D, PA-C    Allergies Patient has no known allergies.  No family history on file.  Social History Social History   Tobacco Use  . Smoking status: Current Some Day Smoker  . Smokeless tobacco: Never Used  Substance Use Topics  .  Alcohol use: Yes  . Drug use: Not on file     Review of Systems  Constitutional: No fever/chills Eyes: No visual changes. Cardiovascular: no chest pain. Respiratory: no cough. No SOB. Gastrointestinal: No abdominal pain.  No nausea, no vomiting.   Musculoskeletal: Positive for right foot pain Skin: Negative for rash, abrasions, lacerations, ecchymosis. Neurological: Negative for headaches, focal weakness or numbness. 10-point ROS otherwise negative.  ____________________________________________   PHYSICAL EXAM:  VITAL SIGNS: ED Triage Vitals  Enc Vitals Group     BP 11/28/18 2102 (!) 152/108     Pulse Rate 11/28/18 2102 84     Resp 11/28/18 2102 18     Temp 11/28/18 2102 (!) 97.5 F (36.4 C)     Temp Source 11/28/18 2102 Oral     SpO2 11/28/18 2102 95 %     Weight 11/28/18 2103 145 lb (65.8 kg)     Height 11/28/18 2103 5\' 11"  (1.803 m)     Head Circumference --      Peak Flow --      Pain Score --      Pain Loc --      Pain Edu? --      Excl. in GC? --      Constitutional: Alert and oriented. Well appearing and in no acute distress. Eyes: Conjunctivae are normal. PERRL. EOMI. Head: Atraumatic. Neck: No stridor.    Cardiovascular: Normal rate, regular rhythm. Normal S1 and S2.  Good peripheral circulation. Respiratory: Normal respiratory effort without tachypnea or retractions. Lungs CTAB. Good air entry to the bases with no decreased or absent breath sounds. Musculoskeletal: Full range of motion to all extremities. No gross deformities appreciated.  Visualization of the right foot reveals no gross edema, erythema, ecchymosis.  Patient is tender to palpation along the extensor tendon of the great toe.  No palpable abnormality or deficit.  Patient is able to extend and flex the toe appropriately.  Capillary refill and sensation intact all 5 digits.  Incidental finding of tinea pedis to the plantar aspect as well as the interdigital spaces of the digits are appreciated.   No indication of cellulitis. Neurologic:  Normal speech and language. No gross focal neurologic deficits are appreciated.  Skin:  Skin is warm, dry and intact. No rash noted. Psychiatric: Mood and affect are normal.  Behavior is normal normal. Patient exhibits appropriate insight and judgement.   ____________________________________________   LABS (all labs ordered are listed, but only abnormal results are displayed)  Labs Reviewed - No data to display ____________________________________________  EKG   ____________________________________________  RADIOLOGY   No results found.  ____________________________________________    PROCEDURES  Procedure(s) performed:    Procedures    Medications  meloxicam (MOBIC) tablet 15 mg (15 mg Oral Given 11/28/18 2222)  amLODipine (NORVASC) tablet 5 mg (5 mg Oral Given 11/28/18 2223)     ____________________________________________   INITIAL IMPRESSION / ASSESSMENT AND PLAN / ED COURSE  Pertinent labs & imaging results that were available during my care of the patient were reviewed by me and considered in my medical decision making (see chart for details).  Review of the [place] CSRS was performed in accordance of the [facility] prior to dispensing any controlled drugs.      Patient's diagnosis is consistent with tendinitis of the foot, tinea pedis of the right foot, essential hypertension.  Patient presents emergency department with main complaint of right foot pain and requesting medication for his hypertension.  On exam, findings are consistent with mild tendinitis of the extensor tendon of the great toe.  Incidentally, tinea pedis is also discovered on exam.  Patient will be prescribed meloxicam and Diflucan for these complaints.  Patient is also requesting medication for his hypertension.  Patient will be placed on Norvasc, 5 mg daily.  Follow-up with primary care for further refills of anti--hypertensive medications..  Patient is given  ED precautions to return to the ED for any worsening or new symptoms.     ____________________________________________  FINAL CLINICAL IMPRESSION(S) / ED DIAGNOSES  Final diagnoses:  Tendinitis of foot  Tinea pedis of right foot  Essential hypertension      NEW MEDICATIONS STARTED DURING THIS VISIT:  ED Discharge Orders         Ordered    meloxicam (MOBIC) 15 MG tablet  Daily     11/28/18 2208    amLODipine (NORVASC) 5 MG tablet  Daily     11/28/18 2208    fluconazole (DIFLUCAN) 150 MG tablet  Weekly     11/28/18 2208              This chart was dictated using voice recognition software/Dragon. Despite best efforts to proofread, errors can occur which can change the meaning. Any change was purely unintentional.    Racheal Patches, PA-C 11/28/18 2243    Sharman Cheek, MD 11/30/18 [phone]

## 2018-12-08 ENCOUNTER — Emergency Department: Payer: Medicaid Other

## 2018-12-08 ENCOUNTER — Emergency Department
Admission: EM | Admit: 2018-12-08 | Discharge: 2018-12-08 | Disposition: A | Discharge status: Home / Self Care | Payer: Medicaid Other | Attending: Emergency Medicine | Admitting: Emergency Medicine

## 2018-12-08 ENCOUNTER — Other Ambulatory Visit: Payer: Self-pay

## 2018-12-08 ENCOUNTER — Encounter: Payer: Self-pay | Admitting: Emergency Medicine

## 2018-12-08 DIAGNOSIS — F17210 Nicotine dependence, cigarettes, uncomplicated: Secondary | ICD-10-CM | POA: Diagnosis not present

## 2018-12-08 DIAGNOSIS — I10 Essential (primary) hypertension: Secondary | ICD-10-CM | POA: Insufficient documentation

## 2018-12-08 DIAGNOSIS — Z79899 Other long term (current) drug therapy: Secondary | ICD-10-CM | POA: Insufficient documentation

## 2018-12-08 DIAGNOSIS — M545 Low back pain, unspecified: Secondary | ICD-10-CM

## 2018-12-08 MED ORDER — LIDOCAINE 5 % EX PTCH
1.0000 | MEDICATED_PATCH | CUTANEOUS | Status: DC
  Administered 2018-12-08: 1 via TRANSDERMAL
  Filled 2018-12-08: qty 1

## 2018-12-08 MED ORDER — LIDOCAINE 5 % EX PTCH: 1.0000 | MEDICATED_PATCH | Freq: Two times a day (BID) | CUTANEOUS | 0 refills | Status: AC

## 2018-12-08 NOTE — ED Notes (Signed)
Discharge instructions and Rx reviewed were given to pt via stratus video and using sign language. Pt has no questions at time of discharge.

## 2018-12-08 NOTE — ED Notes (Signed)
Provider at beside for pt evaluation at this time.

## 2018-12-08 NOTE — ED Notes (Signed)
Pt was given a meal tray. Pt is comfortable in bed, watching tv. No acute distress noted. This RN will continue to monitor pt.

## 2018-12-08 NOTE — ED Triage Notes (Signed)
Pt arrived to the ED via EMS from a local convenience store for complaints of lower back pain. Pt has a history of the same with multiple visits to the ED for it. Pt is AOX4 in no apparent distress. Pt is deaf.  

## 2018-12-08 NOTE — ED Provider Notes (Signed)
[facility] Emergency Department Provider Note  ____________________________________________   First MD Initiated Contact with Patient 12/08/18 [phone]     (approximate)  I have reviewed the triage vital signs and the nursing notes.   HISTORY  Chief Complaint Back Pain  History/ROS limited by the patient being deaf and mute the need to utilize an [employer] Sign Language interpreter via the computer.  HPI Cameron Hunt is a 58 y.o. male with history of deafness and hypertension as well as chronic alcohol abuse.  He presents by private vehicle for evaluation of low back pain.  He states that the back pain is been present for a few days.  He did not sustain any trauma or injury of which he is aware, but he made the comment that "maybe I am just getting old".  He says that he is able to walk without difficulty but it does hurt him to do so.  His back hurts worse when he bends over and then stands back up straight.  He has had some urinary incontinence but this has been going on for years since an abdominal surgery.  He has not had any urinary retention or bowel incontinence nor constipation.  He denies fever/chills, chest pain, shortness of breath, nausea, vomiting, and abdominal pain.  He is quite hungry and asked for a meal tray shortly after arrival to the emergency department.  Past Medical History:  Diagnosis Date  . Deaf   . Hypertension     Patient Active Problem List   Diagnosis Date Noted  . 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 Alcohol abuse 05/26/2018  . Deaf 05/26/2018    History reviewed. No pertinent surgical history.  Prior to Admission medications   Medication Sig Start Date End Date Taking? Authorizing Provider  amLODipine (NORVASC) 5 MG tablet Take 1 tablet (5 mg total) by mouth daily. 11/28/18 11/28/19  Cuthriell, Delorise RoyalsJonathan D, PA-C  fluconazole (DIFLUCAN) 150 MG tablet Take 1 tablet (150 mg total) by mouth once a week. 11/28/18   Cuthriell,  Delorise RoyalsJonathan D, PA-C  lidocaine (LIDODERM) 5 % Place 1 patch onto the skin every 12 (twelve) hours. Remove & Discard patch within 12 hours or as directed by MD.  Wynelle FannyLeave the patch off for 12 hours before applying a new one. 12/08/18 12/08/19  Loleta RoseForbach, Cherissa Hook, MD  meloxicam (MOBIC) 15 MG tablet Take 1 tablet (15 mg total) by mouth daily. 11/28/18   Cuthriell, Delorise RoyalsJonathan D, PA-C    Allergies Patient has no known allergies.  History reviewed. No pertinent family history.  Social History Social History   Tobacco Use  . Smoking status: Current Some Day Smoker  . Smokeless tobacco: Never Used  Substance Use Topics  . Alcohol use: Yes  . Drug use: Not on file    Review of Systems  Constitutional: No fever/chills. Cardiovascular: Denies chest pain. Respiratory: Denies shortness of breath. Gastrointestinal: No abdominal pain.  No nausea, no vomiting.  No diarrhea.  No constipation.  No loss of bowel control. Genitourinary: Chronic mild intermittent urinary incontinence but no acute changes and no urinary retention. Musculoskeletal: Negative for neck pain.  Negative for back pain. Integumentary: Negative for rash. Neurological: Negative for headaches, focal weakness or numbness.   ____________________________________________   PHYSICAL EXAM:  VITAL SIGNS: ED Triage Vitals  Enc Vitals Group     BP 12/08/18 0213 115/71     Pulse Rate 12/08/18 0213 (!) 102     Resp 12/08/18 0213 16     Temp  12/08/18 0213 98.1 F (36.7 C)     Temp Source 12/08/18 0213 Oral     SpO2 12/08/18 0211 98 %     Weight --      Height --      Head Circumference --      Peak Flow --      Pain Score 12/08/18 0603 10     Pain Loc --      Pain Edu? --      Excl. in GC? --     Constitutional: Alert and oriented. Well appearing and in no acute distress. Eyes: Conjunctivae are normal.  Head: Atraumatic. Neck: No stridor.  No meningeal signs.   Cardiovascular: Normal rate, regular rhythm. Good peripheral  circulation. Respiratory: Normal respiratory effort.  No retractions.  Musculoskeletal: No lower extremity tenderness nor edema. No gross deformities of extremities.  He has some mild tenderness of the paraspinal muscles in the lumbar spine but no acute abnormalities or gross deformities or step-offs palpable.  No fluctuance nor induration.  No erythema. Neurologic:  Normal speech and language. No gross focal neurologic deficits are appreciated.  He is ambulatory without difficulty and has no loss of strength or subjective sensation loss. Skin:  Skin is warm, dry and intact. No rash noted. Psychiatric: Mood and affect are normal. Speech and behavior are normal.  ____________________________________________   LABS (all labs ordered are listed, but only abnormal results are displayed)  Labs Reviewed - No data to display ____________________________________________  EKG  No indication for EKG ____________________________________________  RADIOLOGY   ED MD interpretation:  No acute abnormalities on plain films   Official radiology report(s): Dg Lumbar Spine 2-3 Views  Result Date: 12/08/2018 CLINICAL DATA:  Lower back pain. EXAM: LUMBAR SPINE - 2-3 VIEW COMPARISON:  None. FINDINGS: There is no evidence of lumbar spine fracture. Alignment is normal. Intervertebral disc spaces are maintained. IMPRESSION: Negative. Electronically Signed   By: Marnee Spring M.D.   On: 12/08/2018 07:44    ____________________________________________   PROCEDURES  Critical Care performed: No   Procedure(s) performed:   Procedures   ____________________________________________   INITIAL IMPRESSION / ASSESSMENT AND PLAN / ED COURSE  As part of my medical decision making, I reviewed the following data within the electronic MEDICAL RECORD NUMBER Nursing notes reviewed and incorporated, Interpreter needed, Old chart reviewed, Notes from prior ED visits and [place] Controlled Substance Database    The  patient has a history of alcohol abuse but denies IV drug use and he has no signs or symptoms of an indolent infection such as from an epidural abscess, osteomyelitis/discitis, etc., which would constitute a medical/surgical emergency.  He is ambulatory without difficulty and was initially seemingly more concerned about getting some food then about his back pain.  He is very pleasant, seems to be in a good mood, and appropriately interactive.  I am reassured after utilizing the [place] sign language interpreter that it is unlikely he has an emergent medical condition at this time given that he has no signs of cauda equina and no signs of an acute infectious process as described above.  I will check radiographs of his lumbar spine to make sure there is no evidence of a bony abnormality and we will place a Lidoderm patch.  He is comfortable with the plan for discharge and outpatient follow-up with the radiographs are normal.  Clinical Course as of Dec 08 802  Tue Dec 08, 2018  0754 Negative lumbar films, will d/c as per plan  DG Lumbar Spine 2-3 Views [CF]    Clinical Course User Index [CF] Loleta Rose, MD    ____________________________________________  FINAL CLINICAL IMPRESSION(S) / ED DIAGNOSES  Final diagnoses:  Bilateral low back pain without sciatica, unspecified chronicity     MEDICATIONS GIVEN DURING THIS VISIT:  Medications  lidocaine (LIDODERM) 5 % 1 patch (has no administration in time range)     ED Discharge Orders         Ordered    lidocaine (LIDODERM) 5 %  Every 12 hours     12/08/18 0755           Note:  This document was prepared using Dragon voice recognition software and may include unintentional dictation errors.   Loleta Rose, MD 12/08/18 [phone]

## 2018-12-08 NOTE — ED Notes (Signed)
Pt arrived to the ED via EMS from a local convenience store for complaints of lower back pain. Pt has a history of the same with multiple visits to the ED for it. Pt is AOX4 in no apparent distress. Pt is deaf.

## 2019-02-25 ENCOUNTER — Emergency Department: Payer: Medicaid Other

## 2019-02-25 ENCOUNTER — Emergency Department
Admission: EM | Admit: 2019-02-25 | Discharge: 2019-02-26 | Disposition: A | Discharge status: Home / Self Care | Payer: Medicaid Other | Attending: Student in an Organized Health Care Education/Training Program | Admitting: Student in an Organized Health Care Education/Training Program

## 2019-02-25 ENCOUNTER — Other Ambulatory Visit: Payer: Self-pay

## 2019-02-25 DIAGNOSIS — Z79899 Other long term (current) drug therapy: Secondary | ICD-10-CM | POA: Diagnosis not present

## 2019-02-25 DIAGNOSIS — M79605 Pain in left leg: Secondary | ICD-10-CM | POA: Insufficient documentation

## 2019-02-25 DIAGNOSIS — F10121 Alcohol abuse with intoxication delirium: Secondary | ICD-10-CM | POA: Insufficient documentation

## 2019-02-25 DIAGNOSIS — I10 Essential (primary) hypertension: Secondary | ICD-10-CM | POA: Insufficient documentation

## 2019-02-25 DIAGNOSIS — R410 Disorientation, unspecified: Secondary | ICD-10-CM | POA: Diagnosis present

## 2019-02-25 DIAGNOSIS — F1010 Alcohol abuse, uncomplicated: Secondary | ICD-10-CM | POA: Diagnosis present

## 2019-02-25 DIAGNOSIS — F10921 Alcohol use, unspecified with intoxication delirium: Secondary | ICD-10-CM

## 2019-02-25 DIAGNOSIS — F17200 Nicotine dependence, unspecified, uncomplicated: Secondary | ICD-10-CM | POA: Diagnosis not present

## 2019-02-25 LAB — URINALYSIS, COMPLETE (UACMP) WITH MICROSCOPIC
Bilirubin Urine: NEGATIVE
Glucose, UA: NEGATIVE mg/dL
Ketones, ur: NEGATIVE mg/dL
Leukocytes,Ua: NEGATIVE
Nitrite: NEGATIVE
Protein, ur: NEGATIVE mg/dL
Specific Gravity, Urine: 1.005 (ref 1.005–1.030)
Squamous Epithelial / LPF: NONE SEEN (ref 0–5)
pH: 5 (ref 5.0–8.0)

## 2019-02-25 LAB — CBC WITH DIFFERENTIAL/PLATELET
Abs Immature Granulocytes: 0.03 10*3/uL (ref 0.00–0.07)
Basophils Absolute: 0.1 10*3/uL (ref 0.0–0.1)
Basophils Relative: 1 %
Eosinophils Absolute: 0.2 10*3/uL (ref 0.0–0.5)
Eosinophils Relative: 1 %
HCT: 43.7 % (ref 39.0–52.0)
Hemoglobin: 14.7 g/dL (ref 13.0–17.0)
Immature Granulocytes: 0 %
Lymphocytes Relative: 21 %
Lymphs Abs: 2.6 10*3/uL (ref 0.7–4.0)
MCH: 30.8 pg (ref 26.0–34.0)
MCHC: 33.6 g/dL (ref 30.0–36.0)
MCV: 91.6 fL (ref 80.0–100.0)
Monocytes Absolute: 1 10*3/uL (ref 0.1–1.0)
Monocytes Relative: 8 %
Neutro Abs: 8.3 10*3/uL — ABNORMAL HIGH (ref 1.7–7.7)
Neutrophils Relative %: 69 %
Platelets: 316 10*3/uL (ref 150–400)
RBC: 4.77 MIL/uL (ref 4.22–5.81)
RDW: 15.1 % (ref 11.5–15.5)
WBC: 12.2 10*3/uL — ABNORMAL HIGH (ref 4.0–10.5)
nRBC: 0 % (ref 0.0–0.2)

## 2019-02-25 LAB — URINE DRUG SCREEN, QUALITATIVE (ARMC ONLY)
Amphetamines, Ur Screen: NOT DETECTED
Barbiturates, Ur Screen: NOT DETECTED
Benzodiazepine, Ur Scrn: NOT DETECTED
Cannabinoid 50 Ng, Ur ~~LOC~~: POSITIVE — AB
Cocaine Metabolite,Ur ~~LOC~~: NOT DETECTED
MDMA (Ecstasy)Ur Screen: NOT DETECTED
Methadone Scn, Ur: NOT DETECTED
Opiate, Ur Screen: NOT DETECTED
Phencyclidine (PCP) Ur S: NOT DETECTED
Tricyclic, Ur Screen: NOT DETECTED

## 2019-02-25 LAB — COMPREHENSIVE METABOLIC PANEL
ALT: 22 U/L (ref 0–44)
AST: 34 U/L (ref 15–41)
Albumin: 4.4 g/dL (ref 3.5–5.0)
Alkaline Phosphatase: 103 U/L (ref 38–126)
Anion gap: 10 (ref 5–15)
BUN: 9 mg/dL (ref 6–20)
CO2: 25 mmol/L (ref 22–32)
Calcium: 8.6 mg/dL — ABNORMAL LOW (ref 8.9–10.3)
Chloride: 106 mmol/L (ref 98–111)
Creatinine, Ser: 0.64 mg/dL (ref 0.61–1.24)
GFR calc Af Amer: >60 mL/min (ref >60)
GFR calc non Af Amer: >60 mL/min (ref >60)
Glucose, Bld: 80 mg/dL (ref 70–99)
Potassium: 4.9 mmol/L (ref 3.5–5.1)
Sodium: 141 mmol/L (ref 135–145)
Total Bilirubin: 0.9 mg/dL (ref 0.3–1.2)
Total Protein: 8.2 g/dL — ABNORMAL HIGH (ref 6.5–8.1)

## 2019-02-25 LAB — ACETAMINOPHEN LEVEL: Acetaminophen (Tylenol), Serum: <10 ug/mL — ABNORMAL LOW (ref 10–30)

## 2019-02-25 LAB — ETHANOL: Alcohol, Ethyl (B): 262 mg/dL — ABNORMAL HIGH (ref <10)
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
## 2019-02-25 NOTE — ED Notes (Signed)
TTS and Psych NP in to see pt.

## 2019-02-25 NOTE — ED Triage Notes (Signed)
Pt to interpreter that he was walking and something is wrong with left leg. No fall or injury per patient to interpreter.

## 2019-02-25 NOTE — ED Provider Notes (Signed)
[facility] Emergency Department Provider Note    First MD Initiated Contact with Patient 02/25/19 1648     (approximate)  I have reviewed the triage vital signs and the nursing notes.   HISTORY  Chief Complaint Leg Pain  Level V Caveat: AMS  HPI Cameron Hunt is a 58 y.o. male with a history of alcoholic psychosis deafness and hypertension presents the ER after being found on the ground in front of [employer].    Patient very confused.  Uncertain as to what city he is in.  States that "everyone is out to get him ".  Worried that people are trying to kill him.  States he does have some left leg pain but was able to ambulate.  Uncertain as to whether he fell.  He states he does not like to drink because it "does bad things to you.  "But admits to drinking beer today.   Past Medical History:  Diagnosis Date  . Deaf   . Hypertension    History reviewed. No pertinent family history. History reviewed. No pertinent surgical history. Patient Active Problem List   Diagnosis Date Noted  . 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 Alcohol abuse 05/26/2018  . Deaf 05/26/2018      Prior to Admission medications   Medication Sig Start Date End Date Taking? Authorizing Provider  amLODipine (NORVASC) 5 MG tablet Take 1 tablet (5 mg total) by mouth daily. 11/28/18 11/28/19  Cuthriell, Delorise Royals, PA-C  fluconazole (DIFLUCAN) 150 MG tablet Take 1 tablet (150 mg total) by mouth once a week. 11/28/18   Cuthriell, Delorise Royals, PA-C  lidocaine (LIDODERM) 5 % Place 1 patch onto the skin every 12 (twelve) hours. Remove & Discard patch within 12 hours or as directed by MD.  Wynelle Fanny the patch off for 12 hours before applying a new one. 12/08/18 12/08/19  Loleta Rose, MD  meloxicam (MOBIC) 15 MG tablet Take 1 tablet (15 mg total) by mouth daily. 11/28/18   Cuthriell, Delorise Royals, PA-C    Allergies Patient has no known allergies.    Social History Social History   Tobacco  Use  . Smoking status: Current Some Day Smoker  . Smokeless tobacco: Never Used  Substance Use Topics  . Alcohol use: Yes  . Drug use: Not on file    Review of Systems Patient denies headaches, rhinorrhea, blurry vision, numbness, shortness of breath, chest pain, edema, cough, abdominal pain, nausea, vomiting, diarrhea, dysuria, fevers, rashes or hallucinations unless otherwise stated above in HPI. ____________________________________________   PHYSICAL EXAM:  VITAL SIGNS: Vitals:   02/25/19 1705 02/25/19 1900  BP: (!) 146/116 (!) 133/103  Pulse: 77 92  Resp: 18 (!) 22  Temp: 97.8 F (36.6 C)   SpO2: 99% 99%    Constitutional: Aler, disheveled, intoxicated appearing Eyes: Conjunctivae are normal.  Head: Atraumatic. Nose: No congestion/rhinnorhea. Mouth/Throat: Mucous membranes are moist.   Neck: No stridor. Painless ROM.  Cardiovascular: Normal rate, regular rhythm. Grossly normal heart sounds.  Good peripheral circulation. Respiratory: Normal respiratory effort.  No retractions. Lungs CTAB. Gastrointestinal: Soft and nontender. No distention. No abdominal bruits. No CVA tenderness. Genitourinary: deferred Musculoskeletal: No lower extremity tenderness nor edema.  No joint effusions. Neurologic:  Normal speech and language. No gross focal neurologic deficits are appreciated. No facial droop Skin:  Skin is warm, dry and intact. No rash noted. Psychiatric: disheveled, intoxicated appearing ____________________________________________   LABS (all labs ordered are listed, but only abnormal results are displayed)  Results for orders placed or performed during the hospital encounter of 02/25/19 (from the past 24 hour(s))  CBC with Differential/Platelet     Status: Abnormal   Collection Time: 02/25/19  5:24 PM  Result Value Ref Range   WBC 12.2 (H) 4.0 - 10.5 K/uL   RBC 4.77 4.22 - 5.81 MIL/uL   Hemoglobin 14.7 13.0 - 17.0 g/dL   HCT 59.4 70.7 - 61.5 %   MCV 91.6 80.0 -  100.0 fL   MCH 30.8 26.0 - 34.0 pg   MCHC 33.6 30.0 - 36.0 g/dL   RDW 18.3 43.7 - 35.7 %   Platelets 316 150 - 400 K/uL   nRBC 0.0 0.0 - 0.2 %   Neutrophils Relative % 69 %   Neutro Abs 8.3 (H) 1.7 - 7.7 K/uL   Lymphocytes Relative 21 %   Lymphs Abs 2.6 0.7 - 4.0 K/uL   Monocytes Relative 8 %   Monocytes Absolute 1.0 0.1 - 1.0 K/uL   Eosinophils Relative 1 %   Eosinophils Absolute 0.2 0.0 - 0.5 K/uL   Basophils Relative 1 %   Basophils Absolute 0.1 0.0 - 0.1 K/uL   Immature Granulocytes 0 %   Abs Immature Granulocytes 0.03 0.00 - 0.07 K/uL  Comprehensive metabolic panel     Status: Abnormal   Collection Time: 02/25/19  5:24 PM  Result Value Ref Range   Sodium 141 135 - 145 mmol/L   Potassium 4.9 3.5 - 5.1 mmol/L   Chloride 106 98 - 111 mmol/L   CO2 25 22 - 32 mmol/L   Glucose, Bld 80 70 - 99 mg/dL   BUN 9 6 - 20 mg/dL   Creatinine, Ser 8.97 0.61 - 1.24 mg/dL   Calcium 8.6 (L) 8.9 - 10.3 mg/dL   Total Protein 8.2 (H) 6.5 - 8.1 g/dL   Albumin 4.4 3.5 - 5.0 g/dL   AST 34 15 - 41 U/L   ALT 22 0 - 44 U/L   Alkaline Phosphatase 103 38 - 126 U/L   Total Bilirubin 0.9 0.3 - 1.2 mg/dL   GFR calc non Af Amer >60 >60 mL/min   GFR calc Af Amer >60 >60 mL/min   Anion gap 10 5 - 15  Ethanol     Status: Abnormal   Collection Time: 02/25/19  5:24 PM  Result Value Ref Range   Alcohol, Ethyl (B) 262 (H) <10 mg/dL  Acetaminophen level     Status: Abnormal   Collection Time: 02/25/19  5:24 PM  Result Value Ref Range   Acetaminophen (Tylenol), Serum <10 (L) 10 - 30 ug/mL  Urine Drug Screen, Qualitative ([facility] only)     Status: Abnormal   Collection Time: 02/25/19  5:24 PM  Result Value Ref Range   Tricyclic, Ur Screen NONE DETECTED NONE DETECTED   Amphetamines, Ur Screen NONE DETECTED NONE DETECTED   MDMA (Ecstasy)Ur Screen NONE DETECTED NONE DETECTED   Cocaine Metabolite,Ur [place] NONE DETECTED NONE DETECTED   Opiate, Ur Screen NONE DETECTED NONE DETECTED   Phencyclidine (PCP) Ur S NONE  DETECTED NONE DETECTED   Cannabinoid 50 Ng, Ur [place] POSITIVE (A) NONE DETECTED   Barbiturates, Ur Screen NONE DETECTED NONE DETECTED   Benzodiazepine, Ur Scrn NONE DETECTED NONE DETECTED   Methadone Scn, Ur NONE DETECTED NONE DETECTED  Urinalysis, Complete w Microscopic     Status: Abnormal   Collection Time: 02/25/19  5:24 PM  Result Value Ref Range   Color, Urine STRAW (A) YELLOW  APPearance CLEAR (A) CLEAR   Specific Gravity, Urine 1.005 1.005 - 1.030   pH 5.0 5.0 - 8.0   Glucose, UA NEGATIVE NEGATIVE mg/dL   Hgb urine dipstick SMALL (A) NEGATIVE   Bilirubin Urine NEGATIVE NEGATIVE   Ketones, ur NEGATIVE NEGATIVE mg/dL   Protein, ur NEGATIVE NEGATIVE mg/dL   Nitrite NEGATIVE NEGATIVE   Leukocytes,Ua NEGATIVE NEGATIVE   RBC / HPF 0-5 0 - 5 RBC/hpf   WBC, UA 0-5 0 - 5 WBC/hpf   Bacteria, UA RARE (A) NONE SEEN   Squamous Epithelial / LPF NONE SEEN 0 - 5   ____________________________________________  EKG My review and personal interpretation at Time: 17:30   Indication: ams  Rate: 75  Rhythm: sinus Axis: normal Other: baseline wander, no depressions, no stemi criteria ____________________________________________  RADIOLOGY  I personally reviewed all radiographic images ordered to evaluate for the above acute complaints and reviewed radiology reports and findings.  These findings were personally discussed with the patient.  Please see medical record for radiology report.  ____________________________________________   PROCEDURES  Procedure(s) performed:  Procedures    Critical Care performed: no ____________________________________________   INITIAL IMPRESSION / ASSESSMENT AND PLAN / ED COURSE  Pertinent labs & imaging results that were available during my care of the patient were reviewed by me and considered in my medical decision making (see chart for details).   DDX: Psychosis, delirium, medication effect, noncompliance, polysubstance abuse, Si, Hi, depression,  fracture, contusion, electrolyte abn   Cameron Hunt is a 58 y.o. who presents to the ED with symptoms as described above.  Patient very bizarre and intoxicated.  Limiting history secondary to patient being deaf.  He is afebrile.  Blood will be sent for blood differential will order CT head due to his altered mental status order x-rays to evaluate for the but differential.  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  Patient will be placed under IVC as he is paranoid and is a danger to himself.  Clinical Course as of Feb 25 2047  Thu Feb 25, 2019  1843 Imaging and blood work is reassuring with exception of alcohol intoxication.  At this point patient will be kept in the ER for psychiatric evaluation.   [PR]    Clinical Course User Index [PR] Willy Eddyobinson, Shelisha Gautier, MD    The patient was evaluated in Emergency Department today for the symptoms described in the history of present illness. He/she was evaluated in the context of the global COVID-19 pandemic, which necessitated consideration that the patient might be at risk for infection with the SARS-CoV-2 virus that causes COVID-19. Institutional protocols and algorithms that pertain to the evaluation of patients at risk for COVID-19 are in a state of rapid change based on information released by regulatory bodies including the CDC and federal and state organizations. These policies and algorithms were followed during the patient's care in the ED.  As part of my medical decision making, I reviewed the following data within the electronic MEDICAL RECORD NUMBER Nursing notes reviewed and incorporated, Labs reviewed, notes from prior ED visits and [place] Controlled Substance Database   ____________________________________________   FINAL CLINICAL IMPRESSION(S) / ED DIAGNOSES  Final diagnoses:  Alcohol intoxication with delirium (HCC)      NEW MEDICATIONS STARTED DURING THIS VISIT:  New Prescriptions   No medications on file     Note:  This  document was prepared using Dragon voice recognition software and may include unintentional dictation errors.    Willy Eddyobinson, Navayah Sok, MD 02/25/19  2048  

## 2019-02-25 NOTE — BH Assessment (Signed)
Patient has been seen by Psych NP Mikki Santee) and will be discharged in the morning.  TTS isn't needed at this time.

## 2019-02-25 NOTE — ED Notes (Addendum)
Pt undressed into hospital gown. Removed 1 shirt, 2 pairs of pants, 1 pair of boxers, 2 pairs of socks, 1 wash cloth, 1 hat, 1 pair of tennis shoes.

## 2019-02-25 NOTE — ED Notes (Addendum)
Date and time results received: 02/25/19 1753 (use smartphrase ".now" to insert current time)  Test: ETOH Critical Value: 262  Name of Provider Notified: Roxan Hockey

## 2019-02-25 NOTE — ED Notes (Signed)
When pt returned from CT his IV was out of the left hand, catheter tip intact

## 2019-02-25 NOTE — ED Notes (Signed)
To CT w/ BPD

## 2019-02-26 DIAGNOSIS — F1010 Alcohol abuse, uncomplicated: Secondary | ICD-10-CM

## 2019-02-26 NOTE — Consult Note (Signed)
[facility] Face-to-Face Psychiatry Consult   Reason for Consult: Alcohol delirium Referring Physician: Dr. Roxan Hunt Patient Identification: Cameron Hunt MRN:  [id number] Principal Diagnosis: Alcohol abuse Diagnosis:  Principal Problem:   Alcohol abuse   Total Time spent with patient: 1.5 hours  Subjective: Through The American Sign Language Interpreter (ASLI): "My leg was hurting me and I fell.  They brought me into the hospital." Cameron Hunt is a 58 y.o. deaf male patient presented to [facility] ED via law enforcement under involuntary commitment status (IVC). The patient was seen face-to-face by this provider; chart reviewed and consulted with Cameron Hunt on 02/25/2019 due to the care of the patient. It was discussed with the provider that the patient does not meet criteria to be admitted to the inpatient unit. The patient could benefit more from outpatient or inpatient substance use disorder treatment. The patient currently is suffering from alcohol intoxication Test: ETOH Critical Value: 262  On evaluation the patient was assessed using (ASLI) video interpreter services.  The patient is alert and oriented x3, calm and cooperative, and mood-congruent with affect.  The patient does not appear to be responding to internal or external stimuli. Neither is the patient presenting with any delusional thinking. The patient denies auditory or visual hallucinations. The patient denies any suicidal, homicidal, or self-harm ideations. The patient is not presenting with any psychotic or paranoid behaviors. During an encounter with the patient, he was able to answer questions appropriately. Collateral was obtained by daughter Cameron Hunt [phone]) who expresses that it is her father normal behavior. He is an alcoholic and this has been the norm for him.  She states, "he drinks passes out anywhere. He gets picked up by the police and they take him to the hospital and then he is discharged; then the cycle continues.      Plan: the patient is not a safety risk to self or others. He is not requiring inpatient admission for stabilization and treatment.  The patient will benefit from outpatient substance use disorder treatment if he chooses to do so.  HPI: Per Cameron Hunt; Cameron Hunt is a 58 y.o. male with a history of alcoholic psychosis deafness and hypertension presents the ER after being found on the ground in front of [employer].   Patient very confused.  Uncertain as to what city he is in.  States that "everyone is out to get him ".  Worried that people are trying to kill him.  States he does have some left leg pain but was able to ambulate.  Uncertain as to whether he fell.  He states he does not like to drink because it "does bad things to you.  "But admits to drinking beer today.   Past Psychiatric History:  Alcoholic psychosis (HCC) Alcohol abuse  Risk to Self:  No Risk to Others:  No Prior Inpatient Therapy:  No Prior Outpatient Therapy:  No  Past Medical History:  Past Medical History:  Diagnosis Date  . Deaf   . Hypertension    History reviewed. No pertinent surgical history. Family History: History reviewed. No pertinent family history. Family Psychiatric  History:  History reviewed. No pertinent family history. Social History:  Not given Social History   Substance and Sexual Activity  Alcohol Use Yes     Social History   Substance and Sexual Activity  Drug Use Not on file    Social History   Socioeconomic History  . Marital status: Single    Spouse name: Not on file  . Number  of children: Not on file  . Years of education: Not on file  . Highest education level: Not on file  Occupational History  . Not on file  Social Needs  . Financial resource strain: Not on file  . Food insecurity:    Worry: Not on file    Inability: Not on file  . Transportation needs:    Medical: Not on file    Non-medical: Not on file  Tobacco Use  . Smoking status: Current Some Day  Smoker  . Smokeless tobacco: Never Used  Substance and Sexual Activity  . Alcohol use: Yes  . Drug use: Not on file  . Sexual activity: Not on file  Lifestyle  . Physical activity:    Days per week: Not on file    Minutes per session: Not on file  . Stress: Not on file  Relationships  . Social connections:    Talks on phone: Not on file    Gets together: Not on file    Attends religious service: Not on file    Active member of club or organization: Not on file    Attends meetings of clubs or organizations: Not on file    Relationship status: Not on file  Other Topics Concern  . Not on file  Social History Narrative  . Not on file   Additional Social History:    Allergies:  No Known Allergies  Labs:  Results for orders placed or performed during the hospital encounter of 02/25/19 (from the past 48 hour(s))  CBC with Differential/Platelet     Status: Abnormal   Collection Time: 02/25/19  5:24 PM  Result Value Ref Range   WBC 12.2 (H) 4.0 - 10.5 K/uL   RBC 4.77 4.22 - 5.81 MIL/uL   Hemoglobin 14.7 13.0 - 17.0 g/dL   HCT 99.3 71.6 - 96.7 %   MCV 91.6 80.0 - 100.0 fL   MCH 30.8 26.0 - 34.0 pg   MCHC 33.6 30.0 - 36.0 g/dL   RDW 89.3 81.0 - 17.5 %   Platelets 316 150 - 400 K/uL   nRBC 0.0 0.0 - 0.2 %   Neutrophils Relative % 69 %   Neutro Abs 8.3 (H) 1.7 - 7.7 K/uL   Lymphocytes Relative 21 %   Lymphs Abs 2.6 0.7 - 4.0 K/uL   Monocytes Relative 8 %   Monocytes Absolute 1.0 0.1 - 1.0 K/uL   Eosinophils Relative 1 %   Eosinophils Absolute 0.2 0.0 - 0.5 K/uL   Basophils Relative 1 %   Basophils Absolute 0.1 0.0 - 0.1 K/uL   Immature Granulocytes 0 %   Abs Immature Granulocytes 0.03 0.00 - 0.07 K/uL    Comment: Performed at [facility], [address] [place], [place] [address]  Comprehensive metabolic panel     Status: Abnormal   Collection Time: 02/25/19  5:24 PM  Result Value Ref Range   Sodium 141 135 - 145 mmol/L   Potassium 4.9 3.5 - 5.1 mmol/L     Comment: HEMOLYSIS AT THIS LEVEL MAY AFFECT RESULT   Chloride 106 98 - 111 mmol/L   CO2 25 22 - 32 mmol/L   Glucose, Bld 80 70 - 99 mg/dL   BUN 9 6 - 20 mg/dL   Creatinine, Ser 5.27 0.61 - 1.24 mg/dL   Calcium 8.6 (L) 8.9 - 10.3 mg/dL   Total Protein 8.2 (H) 6.5 - 8.1 g/dL   Albumin 4.4 3.5 - 5.0 g/dL   AST 34  15 - 41 U/L    Comment: HEMOLYSIS AT THIS LEVEL MAY AFFECT RESULT   ALT 22 0 - 44 U/L   Alkaline Phosphatase 103 38 - 126 U/L   Total Bilirubin 0.9 0.3 - 1.2 mg/dL    Comment: HEMOLYSIS AT THIS LEVEL MAY AFFECT RESULT   GFR calc non Af Amer >60 >60 mL/min   GFR calc Af Amer >60 >60 mL/min   Anion gap 10 5 - 15    Comment: Performed at [facility], [address] [place], [place] [address]  Ethanol     Status: Abnormal   Collection Time: 02/25/19  5:24 PM  Result Value Ref Range   Alcohol, Ethyl (B) 262 (H) <10 mg/dL    Comment: CRITICAL RESULT CALLED TO, READ BACK BY AND VERIFIED WITH VALERIE CHANDLER 02/25/19 @ 1754  MLK (NOTE) Lowest detectable limit for serum alcohol is 10 mg/dL. For medical purposes only. Performed at [facility], [address] [place], [place] [address]   Acetaminophen level     Status: Abnormal   Collection Time: 02/25/19  5:24 PM  Result Value Ref Range   Acetaminophen (Tylenol), Serum <10 (L) 10 - 30 ug/mL    Comment: (NOTE) Therapeutic concentrations vary significantly. A range of 10-30 ug/mL  may be an effective concentration for many patients. However, some  are best treated at concentrations outside of this range. Acetaminophen concentrations >150 ug/mL at 4 hours after ingestion  and >50 ug/mL at 12 hours after ingestion are often associated with  toxic reactions. Performed at [facility], [address] [place], [place] [address]   Urine Drug Screen, Qualitative [facility] only)     Status: Abnormal   Collection Time: 02/25/19  5:24 PM  Result Value Ref Range   Tricyclic, Ur Screen NONE DETECTED NONE  DETECTED   Amphetamines, Ur Screen NONE DETECTED NONE DETECTED   MDMA (Ecstasy)Ur Screen NONE DETECTED NONE DETECTED   Cocaine Metabolite,Ur [place] NONE DETECTED NONE DETECTED   Opiate, Ur Screen NONE DETECTED NONE DETECTED   Phencyclidine (PCP) Ur S NONE DETECTED NONE DETECTED   Cannabinoid 50 Ng, Ur [place] POSITIVE (A) NONE DETECTED   Barbiturates, Ur Screen NONE DETECTED NONE DETECTED   Benzodiazepine, Ur Scrn NONE DETECTED NONE DETECTED   Methadone Scn, Ur NONE DETECTED NONE DETECTED    Comment: (NOTE) Tricyclics + metabolites, urine    Cutoff 1000 ng/mL Amphetamines + metabolites, urine  Cutoff 1000 ng/mL MDMA (Ecstasy), urine              Cutoff 500 ng/mL Cocaine Metabolite, urine          Cutoff 300 ng/mL Opiate + metabolites, urine        Cutoff 300 ng/mL Phencyclidine (PCP), urine         Cutoff 25 ng/mL Cannabinoid, urine                 Cutoff 50 ng/mL Barbiturates + metabolites, urine  Cutoff 200 ng/mL Benzodiazepine, urine              Cutoff 200 ng/mL Methadone, urine                   Cutoff 300 ng/mL The urine drug screen provides only a preliminary, unconfirmed analytical test result and should not be used for non-medical purposes. Clinical consideration and professional judgment should be applied to any positive drug screen result due to possible interfering substances. A more specific alternate chemical method must  be used in order to obtain a confirmed analytical result. Gas chromatography / mass spectrometry (GC/MS) is the preferred confirmat ory method. Performed at [facility], [address] [place], [place] [address]   Urinalysis, Complete w Microscopic     Status: Abnormal   Collection Time: 02/25/19  5:24 PM  Result Value Ref Range   Color, Urine STRAW (A) YELLOW   APPearance CLEAR (A) CLEAR   Specific Gravity, Urine 1.005 1.005 - 1.030   pH 5.0 5.0 - 8.0   Glucose, UA NEGATIVE NEGATIVE mg/dL   Hgb urine dipstick SMALL (A) NEGATIVE   Bilirubin  Urine NEGATIVE NEGATIVE   Ketones, ur NEGATIVE NEGATIVE mg/dL   Protein, ur NEGATIVE NEGATIVE mg/dL   Nitrite NEGATIVE NEGATIVE   Leukocytes,Ua NEGATIVE NEGATIVE   RBC / HPF 0-5 0 - 5 RBC/hpf   WBC, UA 0-5 0 - 5 WBC/hpf   Bacteria, UA RARE (A) NONE SEEN   Squamous Epithelial / LPF NONE SEEN 0 - 5    Comment: Performed at [facility], [address] [place], [place] [address]    No current facility-administered medications for this encounter.    Current Outpatient Medications  Medication Sig Dispense Refill  . amLODipine (NORVASC) 5 MG tablet Take 1 tablet (5 mg total) by mouth daily. 30 tablet 2  . fluconazole (DIFLUCAN) 150 MG tablet Take 1 tablet (150 mg total) by mouth once a week. 6 tablet 0  . lidocaine (LIDODERM) 5 % Place 1 patch onto the skin every 12 (twelve) hours. Remove & Discard patch within 12 hours or as directed by MD.  Wynelle Fanny the patch off for 12 hours before applying a new one. 10 patch 0  . meloxicam (MOBIC) 15 MG tablet Take 1 tablet (15 mg total) by mouth daily. 30 tablet 0    Musculoskeletal: Strength & Muscle Tone: within normal limits Gait & Station: unsteady Patient leans: N/A  Psychiatric Specialty Exam: Physical Exam  Nursing note and vitals reviewed. Constitutional: He is oriented to person, place, and time. He appears well-developed and well-nourished.  HENT:  Head: Normocephalic and atraumatic.  Eyes: Pupils are equal, round, and reactive to light. Conjunctivae and EOM are normal.  Neck: Normal range of motion. Neck supple.  Cardiovascular: Normal rate.  Respiratory: Effort normal and breath sounds normal.  Musculoskeletal: Normal range of motion.  Neurological: He is alert and oriented to person, place, and time. He has normal reflexes.  Skin: Skin is warm and dry.    Review of Systems  Constitutional: Negative.   HENT: Positive for hearing loss.   Eyes: Negative.   Respiratory: Negative.   Cardiovascular: Negative.    Gastrointestinal: Negative.   Genitourinary: Negative.   Musculoskeletal: Negative.   Skin: Negative.   Neurological: Negative.   Endo/Heme/Allergies: Negative.   Psychiatric/Behavioral: Positive for substance abuse. The patient is nervous/anxious.     Blood pressure (!) 133/103, pulse 92, temperature 97.8 F (36.6 C), temperature source Oral, resp. rate (!) 22, height 6' (1.829 m), weight 68 kg, SpO2 99 %.Body mass index is 20.34 kg/m.  General Appearance: Fairly Groomed and Guarded  Eye Contact:  Good  Speech:  Blocked  Volume:  Patient does not speak  Mood:  NA  Affect:  Congruent  Thought Process:  Goal Directed  Orientation:  Full (Time, Place, and Person)  Thought Content:  Logical  Suicidal Thoughts:  No  Homicidal Thoughts:  No  Memory:  Recent;   Fair  Judgement:  Fair  Insight:  Fair  Psychomotor Activity:  Decreased  Concentration:  Concentration: Fair  Recall:  [employer]Fair  Fund of Knowledge:  Fair  Language:  Poor  Akathisia:  NA  Handed:  Right  AIMS (if indicated):     Assets:  Physical Health Others:  The patient states he came to the hospital to do a check on his leg  ADL's:  Intact  Cognition:  Impaired,  Mild  Sleep:   Good     Treatment Plan Summary: Plan The patient does not meet criteria to be admitted to the inpatient unit  Disposition: No evidence of imminent risk to self or others at present.   Patient does not meet criteria for psychiatric inpatient admission. Supportive therapy provided about ongoing stressors. Refer to IOP. Discussed crisis plan, support from social network, calling 911, coming to the Emergency Department, and calling Suicide Hotline.  Catalina GravelJacqueline Thomspon, NP 02/26/2019 1:35 AM

## 2019-02-26 NOTE — ED Notes (Signed)
Pt discharged to lobby with bus pass. VS stable. Pt denies SI/HI. All belongings returned to patient. Patient signed for discharge paperwork.

## 2019-02-26 NOTE — Discharge Instructions (Signed)
You have been seen in the Emergency Department (ED)  today for a psychiatric complaint.  You have been evaluated by psychiatry and we believe you are safe to be discharged from the hospital.   ° °Please return to the Emergency Department (ED)  immediately if you have ANY thoughts of hurting yourself or anyone else, so that we may help you. ° °Please avoid alcohol and drug use. ° °Follow up with your doctor and/or therapist as soon as possible regarding today's ED  visit.  ° °You may call crisis hotline for [place] at [phone]. ° °

## 2019-02-26 NOTE — ED Provider Notes (Signed)
-----------------------------------------   6:01 AM on 02/26/2019 -----------------------------------------   Blood pressure (!) 133/103, pulse 92, temperature 97.8 F (36.6 C), temperature source Oral, resp. rate (!) 22, height 6' (1.829 m), weight 68 kg, SpO2 99 %.  The patient is sleeping at this time.  There have been no acute events since the last update.  Patient was evaluated by the psychiatric nurse practitioner who plans to restart his IVC and discharge him home later this morning.Irean Hong, MD 02/26/19 [phone]

## 2019-02-26 NOTE — ED Notes (Signed)
Pt dressing for discharge. Maintained on 15 minute checks and observation by security camera for safety. 

## 2019-06-11 ENCOUNTER — Emergency Department
Admission: EM | Admit: 2019-06-11 | Discharge: 2019-06-12 | Disposition: A | Discharge status: Home / Self Care | Payer: Medicaid Other | Attending: Student in an Organized Health Care Education/Training Program | Admitting: Student in an Organized Health Care Education/Training Program

## 2019-06-11 ENCOUNTER — Other Ambulatory Visit: Payer: Self-pay

## 2019-06-11 ENCOUNTER — Encounter: Payer: Self-pay | Admitting: Emergency Medicine

## 2019-06-11 DIAGNOSIS — G9340 Encephalopathy, unspecified: Secondary | ICD-10-CM | POA: Insufficient documentation

## 2019-06-11 DIAGNOSIS — I10 Essential (primary) hypertension: Secondary | ICD-10-CM | POA: Diagnosis not present

## 2019-06-11 DIAGNOSIS — Z79899 Other long term (current) drug therapy: Secondary | ICD-10-CM | POA: Diagnosis not present

## 2019-06-11 DIAGNOSIS — M7121 Synovial cyst of popliteal space [Baker], right knee: Secondary | ICD-10-CM | POA: Diagnosis not present

## 2019-06-11 DIAGNOSIS — F17200 Nicotine dependence, unspecified, uncomplicated: Secondary | ICD-10-CM | POA: Diagnosis not present

## 2019-06-11 DIAGNOSIS — M25561 Pain in right knee: Secondary | ICD-10-CM

## 2019-06-11 DIAGNOSIS — M79604 Pain in right leg: Secondary | ICD-10-CM | POA: Diagnosis present

## 2019-06-11 NOTE — ED Triage Notes (Signed)
Pt presents to ER via [facility]. Pt is deaf, communication via writing reports right knee pain walking on the street and fell drank 4 cans of beer. No respiratory distress noted

## 2019-06-12 ENCOUNTER — Emergency Department: Payer: Medicaid Other

## 2019-06-12 NOTE — ED Notes (Signed)
Provided pt with turkey sandwich and apple juice.

## 2019-06-12 NOTE — ED Notes (Signed)
Pt sleeping; no distress noted

## 2019-06-12 NOTE — ED Notes (Signed)
No peripheral IV placed this visit.    Discharge instructions reviewed with patient. Questions fielded by this RN. Patient verbalizes understanding of instructions. Patient discharged home in stable condition per robinson. No acute distress noted at time of discharge.   Pt given taxi voucher, as pt reports unable to provide transportation.   Ladona Mow called message left

## 2019-06-12 NOTE — ED Notes (Signed)
Pt tried to walk around room, patient was able to stand with no assistance,  but could not walk with steady gait reports right knee discomfort with every step.

## 2019-06-12 NOTE — ED Provider Notes (Addendum)
[facility] Emergency Department Provider Note    First MD Initiated Contact with Patient 06/11/19 2352     (approximate)  I have reviewed the triage vital signs and the nursing notes.   HISTORY  Chief Complaint Fall, Knee Pain, and Alcohol Intoxication    HPI Cameron Hunt is a 58 y.o. male with a history of alcohol abuse presents to the ER after drinking several beers tonight and having a fall onto his right knee.  States he has a history of chronic knee pain.  Denies hitting his head.  No other injuries.  States he would like something to eat.  Denies any nausea or vomiting.  No chest pain or shortness of breath.    Past Medical History:  Diagnosis Date  . Deaf   . Hypertension    No family history on file. History reviewed. No pertinent surgical history. Patient Active Problem List   Diagnosis Date Noted  . 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 Alcohol abuse 05/26/2018  . Deaf 05/26/2018      Prior to Admission medications   Medication Sig Start Date End Date Taking? Authorizing Provider  amLODipine (NORVASC) 5 MG tablet Take 1 tablet (5 mg total) by mouth daily. 11/28/18 11/28/19  Cuthriell, Charline Bills, PA-C  fluconazole (DIFLUCAN) 150 MG tablet Take 1 tablet (150 mg total) by mouth once a week. Patient not taking: Reported on 02/26/2019 11/28/18   Cuthriell, Charline Bills, PA-C  lidocaine (LIDODERM) 5 % Place 1 patch onto the skin every 12 (twelve) hours. Remove & Discard patch within 12 hours or as directed by MD.  Pershing Proud the patch off for 12 hours before applying a new one. 12/08/18 12/08/19  Hinda Kehr, MD  meloxicam (MOBIC) 15 MG tablet Take 1 tablet (15 mg total) by mouth daily. 11/28/18   Cuthriell, Charline Bills, PA-C    Allergies Patient has no known allergies.    Social History Social History   Tobacco Use  . Smoking status: Current Some Day Smoker  . Smokeless tobacco: Never Used  Substance Use Topics  . Alcohol use: Yes  . Drug  use: Not Currently    Review of Systems Patient denies headaches, rhinorrhea, blurry vision, numbness, shortness of breath, chest pain, edema, cough, abdominal pain, nausea, vomiting, diarrhea, dysuria, fevers, rashes or hallucinations unless otherwise stated above in HPI. ____________________________________________   PHYSICAL EXAM:  VITAL SIGNS: Vitals:   06/12/19 0530 06/12/19 0600  BP: 99/71 109/71  Pulse: 80 76  Resp: 16 16  Temp:    SpO2: 97% 98%    Constitutional: Alert, smells of alcohol but nontoxic appearing Eyes: Conjunctivae are normal.  Head: Atraumatic. Nose: No congestion/rhinnorhea. Mouth/Throat: Mucous membranes are moist.   Neck: No stridor. Painless ROM.  Cardiovascular: Normal rate, regular rhythm. Grossly normal heart sounds.  Good peripheral circulation. Respiratory: Normal respiratory effort.  No retractions. Lungs CTAB. Gastrointestinal: Soft and nontender. No distention. No abdominal bruits. No CVA tenderness. Genitourinary:  Musculoskeletal:  ttp of right knee without effusion, or deformity.  No lower extremity tenderness nor edema.  No joint effusions.  2+ DP and PT pulses to BLE.  No erythema or warmth.  No cellulitic changes Neurologic:  Normal speech and language. No gross focal neurologic deficits are appreciated. No facial droop Skin:  Skin is warm, dry and intact. No rash noted. Psychiatric: Mood and affect are normal. Speech and behavior are normal.  ____________________________________________   LABS (all labs ordered are listed, but only abnormal results are  displayed)  No results found for this or any previous visit (from the past 24 hour(s)). ____________________________________________ ____________________________  RADIOLOGY  I personally reviewed all radiographic images ordered to evaluate for the above acute complaints and reviewed radiology reports and findings.  These findings were personally discussed with the patient.  Please  see medical record for radiology report.  ____________________________________________   PROCEDURES  Procedure(s) performed:  Procedures    Critical Care performed: no ____________________________________________   INITIAL IMPRESSION / ASSESSMENT AND PLAN / ED COURSE  Pertinent labs & imaging results that were available during my care of the patient were reviewed by me and considered in my medical decision making (see chart for details).   DDX: fracture, contusion, arthritis, septic arthritis  Cameron Hunt is a 58 y.o. who presents to the ED with recurrent right knee pain after fall.  Denies hitting his head however there is somewhat of a language barrier as the patient has deaf with history of alcohol abuse.  Denies any numbness or tingling.  Denies any nausea vomiting or chest pain.  Clinical Course as of Jun 11 650  Sat Jun 12, 2019  0400 No evidence of a fracture.  No evidence of DVT.  Does have a small popliteal cyst.  Neurovascular intact distally.  Patient able to ambulate with steady gait.  Appears clinically sober and appropriate for discharge home at this time.   [PR]    Clinical Course User Index [PR] Willy Eddyobinson, Cleatus Goodin, MD    The patient was evaluated in Emergency Department today for the symptoms described in the history of present illness. He/she was evaluated in the context of the global COVID-19 pandemic, which necessitated consideration that the patient might be at risk for infection with the SARS-CoV-2 virus that causes COVID-19. Institutional protocols and algorithms that pertain to the evaluation of patients at risk for COVID-19 are in a state of rapid change based on information released by regulatory bodies including the CDC and federal and state organizations. These policies and algorithms were followed during the patient's care in the ED.  As part of my medical decision making, I reviewed the following data within the electronic MEDICAL RECORD NUMBER Nursing  notes reviewed and incorporated, Labs reviewed, notes from prior ED visits and [address] Controlled Substance Database   ____________________________________________   FINAL CLINICAL IMPRESSION(S) / ED DIAGNOSES  Final diagnoses:  Popliteal cyst, right  Acute pain of right knee      NEW MEDICATIONS STARTED DURING THIS VISIT:  New Prescriptions   No medications on file     Note:  This document was prepared using Dragon voice recognition software and may include unintentional dictation errors.        Willy Eddyobinson, Nakari Bracknell, MD 06/12/19 [phone]

## 2019-06-28 ENCOUNTER — Other Ambulatory Visit
Admission: RE | Admit: 2019-06-28 | Discharge: 2019-06-28 | Disposition: A | Discharge status: Home / Self Care | Payer: Medicaid Other | Attending: Family Medicine | Admitting: Family Medicine

## 2019-06-28 NOTE — ED Triage Notes (Signed)
Forensic blood draw per protocol to right AC: pt tolerated well; specimens released to [employer] per chain of custody;

## 2019-08-28 ENCOUNTER — Other Ambulatory Visit: Payer: Self-pay

## 2019-08-28 DIAGNOSIS — Z20822 Contact with and (suspected) exposure to covid-19: Secondary | ICD-10-CM

## 2019-08-30 LAB — NOVEL CORONAVIRUS, NAA: SARS-CoV-2, NAA: NOT DETECTED

## 2019-09-24 ENCOUNTER — Inpatient Hospital Stay (HOSPITAL_COMMUNITY): Payer: Medicaid Other

## 2019-09-24 ENCOUNTER — Emergency Department: Payer: Medicaid Other

## 2019-09-24 ENCOUNTER — Inpatient Hospital Stay (HOSPITAL_COMMUNITY)
Admission: AD | Admit: 2019-09-24 | Discharge: 2019-09-30 | DRG: 100 | Disposition: A | Discharge status: Home / Self Care | Payer: Medicaid Other | Source: Other Acute Inpatient Hospital | Attending: Family Medicine | Admitting: Family Medicine

## 2019-09-24 ENCOUNTER — Encounter: Payer: Self-pay | Admitting: Emergency Medicine

## 2019-09-24 ENCOUNTER — Emergency Department
Admission: EM | Admit: 2019-09-24 | Discharge: 2019-09-24 | Disposition: A | Discharge status: Other Acute Inpatient Hospital | Payer: Medicaid Other | Attending: Emergency Medicine | Admitting: Emergency Medicine

## 2019-09-24 ENCOUNTER — Other Ambulatory Visit: Payer: Self-pay

## 2019-09-24 DIAGNOSIS — J9601 Acute respiratory failure with hypoxia: Secondary | ICD-10-CM | POA: Insufficient documentation

## 2019-09-24 DIAGNOSIS — I10 Essential (primary) hypertension: Secondary | ICD-10-CM | POA: Diagnosis present

## 2019-09-24 DIAGNOSIS — R402113 Coma scale, eyes open, never, at hospital admission: Secondary | ICD-10-CM | POA: Diagnosis not present

## 2019-09-24 DIAGNOSIS — R402343 Coma scale, best motor response, flexion withdrawal, at hospital admission: Secondary | ICD-10-CM | POA: Diagnosis present

## 2019-09-24 DIAGNOSIS — Z1389 Encounter for screening for other disorder: Secondary | ICD-10-CM

## 2019-09-24 DIAGNOSIS — R4701 Aphasia: Secondary | ICD-10-CM | POA: Diagnosis present

## 2019-09-24 DIAGNOSIS — F1010 Alcohol abuse, uncomplicated: Secondary | ICD-10-CM | POA: Diagnosis not present

## 2019-09-24 DIAGNOSIS — G40901 Epilepsy, unspecified, not intractable, with status epilepticus: Principal | ICD-10-CM

## 2019-09-24 DIAGNOSIS — R531 Weakness: Secondary | ICD-10-CM | POA: Diagnosis not present

## 2019-09-24 DIAGNOSIS — D72829 Elevated white blood cell count, unspecified: Secondary | ICD-10-CM | POA: Diagnosis not present

## 2019-09-24 DIAGNOSIS — H9190 Unspecified hearing loss, unspecified ear: Secondary | ICD-10-CM | POA: Diagnosis present

## 2019-09-24 DIAGNOSIS — Z8673 Personal history of transient ischemic attack (TIA), and cerebral infarction without residual deficits: Secondary | ICD-10-CM | POA: Diagnosis not present

## 2019-09-24 DIAGNOSIS — E872 Acidosis: Secondary | ICD-10-CM | POA: Diagnosis not present

## 2019-09-24 DIAGNOSIS — Z20828 Contact with and (suspected) exposure to other viral communicable diseases: Secondary | ICD-10-CM | POA: Diagnosis not present

## 2019-09-24 DIAGNOSIS — R4020 Unspecified coma: Secondary | ICD-10-CM | POA: Diagnosis present

## 2019-09-24 DIAGNOSIS — Z978 Presence of other specified devices: Secondary | ICD-10-CM

## 2019-09-24 DIAGNOSIS — R569 Unspecified convulsions: Secondary | ICD-10-CM

## 2019-09-24 DIAGNOSIS — R402213 Coma scale, best verbal response, none, at hospital admission: Secondary | ICD-10-CM | POA: Diagnosis not present

## 2019-09-24 DIAGNOSIS — G9349 Other encephalopathy: Secondary | ICD-10-CM | POA: Diagnosis not present

## 2019-09-24 DIAGNOSIS — Z0189 Encounter for other specified special examinations: Secondary | ICD-10-CM

## 2019-09-24 DIAGNOSIS — E876 Hypokalemia: Secondary | ICD-10-CM | POA: Diagnosis not present

## 2019-09-24 DIAGNOSIS — R4182 Altered mental status, unspecified: Secondary | ICD-10-CM | POA: Diagnosis present

## 2019-09-24 LAB — CBC
HCT: 39.9 % (ref 39.0–52.0)
Hemoglobin: 13.6 g/dL (ref 13.0–17.0)
MCH: 32.1 pg (ref 26.0–34.0)
MCHC: 34.1 g/dL (ref 30.0–36.0)
MCV: 94.1 fL (ref 80.0–100.0)
Platelets: 301 10*3/uL (ref 150–400)
RBC: 4.24 MIL/uL (ref 4.22–5.81)
RDW: 14.9 % (ref 11.5–15.5)
WBC: 16.2 10*3/uL — ABNORMAL HIGH (ref 4.0–10.5)
nRBC: 0 % (ref 0.0–0.2)

## 2019-09-24 LAB — BLOOD GAS, ARTERIAL
Acid-Base Excess: 4 mmol/L — ABNORMAL HIGH (ref 0.0–2.0)
Bicarbonate: 29.7 mmol/L — ABNORMAL HIGH (ref 20.0–28.0)
FIO2: 0.4
MECHVT: 500 mL
O2 Saturation: 99.5 %
PEEP: 5 cmH2O
Patient temperature: 37
RATE: 14 resp/min
pCO2 arterial: 48 mmHg (ref 32.0–48.0)
pH, Arterial: 7.4 (ref 7.350–7.450)
pO2, Arterial: 166 mmHg — ABNORMAL HIGH (ref 83.0–108.0)

## 2019-09-24 LAB — URINALYSIS, COMPLETE (UACMP) WITH MICROSCOPIC
Bilirubin Urine: NEGATIVE
Glucose, UA: NEGATIVE mg/dL
Ketones, ur: NEGATIVE mg/dL
Leukocytes,Ua: NEGATIVE
Nitrite: NEGATIVE
Protein, ur: 100 mg/dL — AB
Specific Gravity, Urine: 1.026 (ref 1.005–1.030)
pH: 5 (ref 5.0–8.0)

## 2019-09-24 LAB — CBC WITH DIFFERENTIAL/PLATELET
Abs Immature Granulocytes: 0.05 10*3/uL (ref 0.00–0.07)
Basophils Absolute: 0.1 10*3/uL (ref 0.0–0.1)
Basophils Relative: 1 %
Eosinophils Absolute: 0 10*3/uL (ref 0.0–0.5)
Eosinophils Relative: 0 %
HCT: 40.1 % (ref 39.0–52.0)
Hemoglobin: 13.6 g/dL (ref 13.0–17.0)
Immature Granulocytes: 0 %
Lymphocytes Relative: 10 %
Lymphs Abs: 1.3 10*3/uL (ref 0.7–4.0)
MCH: 31.6 pg (ref 26.0–34.0)
MCHC: 33.9 g/dL (ref 30.0–36.0)
MCV: 93 fL (ref 80.0–100.0)
Monocytes Absolute: 0.9 10*3/uL (ref 0.1–1.0)
Monocytes Relative: 7 %
Neutro Abs: 11.2 10*3/uL — ABNORMAL HIGH (ref 1.7–7.7)
Neutrophils Relative %: 82 %
Platelets: 357 10*3/uL (ref 150–400)
RBC: 4.31 MIL/uL (ref 4.22–5.81)
RDW: 15.1 % (ref 11.5–15.5)
WBC: 13.6 10*3/uL — ABNORMAL HIGH (ref 4.0–10.5)
nRBC: 0 % (ref 0.0–0.2)

## 2019-09-24 LAB — URINE DRUG SCREEN, QUALITATIVE (ARMC ONLY)
Amphetamines, Ur Screen: NOT DETECTED
Barbiturates, Ur Screen: NOT DETECTED
Benzodiazepine, Ur Scrn: POSITIVE — AB
Cannabinoid 50 Ng, Ur ~~LOC~~: POSITIVE — AB
Cocaine Metabolite,Ur ~~LOC~~: NOT DETECTED
MDMA (Ecstasy)Ur Screen: NOT DETECTED
Methadone Scn, Ur: NOT DETECTED
Opiate, Ur Screen: NOT DETECTED
Phencyclidine (PCP) Ur S: NOT DETECTED
Tricyclic, Ur Screen: NOT DETECTED

## 2019-09-24 LAB — ACETAMINOPHEN LEVEL: Acetaminophen (Tylenol), Serum: <10 ug/mL — ABNORMAL LOW (ref 10–30)

## 2019-09-24 LAB — AMMONIA: Ammonia: 47 umol/L — ABNORMAL HIGH (ref 9–35)

## 2019-09-24 LAB — CK: Total CK: 315 U/L (ref 49–397)

## 2019-09-24 LAB — GLUCOSE, CAPILLARY: Glucose-Capillary: 124 mg/dL — ABNORMAL HIGH (ref 70–99)

## 2019-09-24 LAB — SALICYLATE LEVEL: Salicylate Lvl: <7 mg/dL (ref 2.8–30.0)

## 2019-09-24 LAB — MAGNESIUM: Magnesium: 2.1 mg/dL (ref 1.7–2.4)

## 2019-09-24 LAB — MRSA PCR SCREENING: MRSA by PCR: NEGATIVE

## 2019-09-24 LAB — TSH: TSH: 2.537 u[IU]/mL (ref 0.350–4.500)

## 2019-09-24 LAB — POC SARS CORONAVIRUS 2 AG: SARS Coronavirus 2 Ag: NEGATIVE

## 2019-09-24 LAB — SARS CORONAVIRUS 2 BY RT PCR (HOSPITAL ORDER, PERFORMED IN ~~LOC~~ HOSPITAL LAB): SARS Coronavirus 2: NEGATIVE

## 2019-09-24 LAB — PHENYTOIN LEVEL, TOTAL: Phenytoin Lvl: 24.8 ug/mL — ABNORMAL HIGH (ref 10.0–20.0)

## 2019-09-24 LAB — ETHANOL: Alcohol, Ethyl (B): <10 mg/dL (ref <10)

## 2019-09-24 LAB — TRIGLYCERIDES: Triglycerides: 47 mg/dL (ref <150)

## 2019-09-24 MED ORDER — PROPOFOL 1000 MG/100ML IV EMUL
0.0000 ug/kg/min | INTRAVENOUS | Status: DC
  Filled 2019-09-24: qty 100

## 2019-09-24 MED ORDER — FENTANYL CITRATE (PF) 100 MCG/2ML IJ SOLN
100.0000 ug | INTRAMUSCULAR | Status: DC | PRN
  Administered 2019-09-24: 100 ug via INTRAVENOUS
  Filled 2019-09-24: qty 2

## 2019-09-24 MED ORDER — SODIUM CHLORIDE 0.9 % IV SOLN: 1.0000 mg | Freq: Once | INTRAVENOUS | Status: DC

## 2019-09-24 MED ORDER — LEVETIRACETAM 500 MG PO TABS: 500.0000 mg | ORAL_TABLET | Freq: Two times a day (BID) | ORAL | Status: DC

## 2019-09-24 MED ORDER — SODIUM CHLORIDE 0.9 % IV SOLN
1400.0000 mg | Freq: Once | INTRAVENOUS | Status: AC
  Administered 2019-09-24: 1400 mg via INTRAVENOUS
  Filled 2019-09-24: qty 28

## 2019-09-24 MED ORDER — SUCCINYLCHOLINE CHLORIDE 20 MG/ML IJ SOLN
INTRAMUSCULAR | Status: AC | PRN
  Administered 2019-09-24: 100 mg via INTRAVENOUS

## 2019-09-24 MED ORDER — THIAMINE HCL 100 MG/ML IJ SOLN
100.0000 mg | Freq: Once | INTRAMUSCULAR | Status: DC
  Filled 2019-09-24: qty 2

## 2019-09-24 MED ORDER — PHENYTOIN SODIUM 50 MG/ML IJ SOLN
100.0000 mg | Freq: Three times a day (TID) | INTRAMUSCULAR | Status: DC
  Administered 2019-09-24 – 2019-09-26 (×6): 100 mg via INTRAVENOUS
  Filled 2019-09-24 (×6): qty 2

## 2019-09-24 MED ORDER — PROPOFOL 1000 MG/100ML IV EMUL
0.0000 ug/kg/min | INTRAVENOUS | Status: DC
  Administered 2019-09-24: 25 ug/kg/min via INTRAVENOUS
  Administered 2019-09-25: 40 ug/kg/min via INTRAVENOUS
  Administered 2019-09-25: 50 ug/kg/min via INTRAVENOUS
  Filled 2019-09-24 (×3): qty 100

## 2019-09-24 MED ORDER — FENTANYL CITRATE (PF) 100 MCG/2ML IJ SOLN: 100.0000 ug | INTRAMUSCULAR | Status: DC | PRN

## 2019-09-24 MED ORDER — LACTATED RINGERS IV SOLN
INTRAVENOUS | Status: DC
  Administered 2019-09-24: 1000 mL via INTRAVENOUS
  Administered 2019-09-25 – 2019-09-29 (×4): via INTRAVENOUS

## 2019-09-24 MED ORDER — SODIUM CHLORIDE 0.9 % IV BOLUS
1000.0000 mL | Freq: Once | INTRAVENOUS | Status: AC
  Administered 2019-09-24: 1000 mL via INTRAVENOUS

## 2019-09-24 MED ORDER — LORAZEPAM 2 MG/ML IJ SOLN
INTRAMUSCULAR | Status: AC
  Filled 2019-09-24: qty 1

## 2019-09-24 MED ORDER — LORAZEPAM 2 MG/ML IJ SOLN: 1.0000 mg | Freq: Once | INTRAMUSCULAR | Status: DC

## 2019-09-24 MED ORDER — ETOMIDATE 2 MG/ML IV SOLN
INTRAVENOUS | Status: AC | PRN
  Administered 2019-09-24: 30 mg via INTRAVENOUS

## 2019-09-24 MED ORDER — FOLIC ACID 5 MG/ML IJ SOLN
1.0000 mg | Freq: Once | INTRAMUSCULAR | Status: DC
  Filled 2019-09-24: qty 0.2

## 2019-09-24 MED ORDER — THIAMINE HCL 100 MG/ML IJ SOLN
100.0000 mg | Freq: Every day | INTRAMUSCULAR | Status: AC
  Administered 2019-09-24 – 2019-09-26 (×3): 100 mg via INTRAVENOUS
  Filled 2019-09-24 (×3): qty 2

## 2019-09-24 MED ORDER — SODIUM CHLORIDE 0.9 % IV SOLN: 20.0000 mg/kg | Freq: Once | INTRAVENOUS | Status: DC

## 2019-09-24 MED ORDER — FENTANYL CITRATE (PF) 100 MCG/2ML IJ SOLN: 25.0000 ug | INTRAMUSCULAR | Status: DC | PRN

## 2019-09-24 MED ORDER — PROPOFOL 1000 MG/100ML IV EMUL
INTRAVENOUS | Status: AC | PRN
  Administered 2019-09-24: 19:00:00 via INTRAVENOUS
  Administered 2019-09-24: 20 ug/kg/min via INTRAVENOUS

## 2019-09-24 MED ORDER — CHLORHEXIDINE GLUCONATE 0.12% ORAL RINSE (MEDLINE KIT)
15.0000 mL | Freq: Two times a day (BID) | OROMUCOSAL | Status: DC
  Administered 2019-09-24 – 2019-09-25 (×2): 15 mL via OROMUCOSAL

## 2019-09-24 MED ORDER — PROPOFOL 1000 MG/100ML IV EMUL
INTRAVENOUS | Status: AC
  Filled 2019-09-24: qty 100

## 2019-09-24 MED ORDER — LORAZEPAM 2 MG/ML IJ SOLN: 2.0000 mg | Freq: Once | INTRAMUSCULAR | Status: DC

## 2019-09-24 MED ORDER — ORAL CARE MOUTH RINSE
15.0000 mL | OROMUCOSAL | Status: DC
  Administered 2019-09-25 (×8): 15 mL via OROMUCOSAL

## 2019-09-24 MED ORDER — PANTOPRAZOLE SODIUM 40 MG IV SOLR
40.0000 mg | INTRAVENOUS | Status: DC
  Administered 2019-09-24 – 2019-09-27 (×4): 40 mg via INTRAVENOUS
  Filled 2019-09-24 (×4): qty 40

## 2019-09-24 MED ORDER — SODIUM CHLORIDE 0.9 % IV SOLN
2000.0000 mg | INTRAVENOUS | Status: AC
  Administered 2019-09-24: 2000 mg via INTRAVENOUS
  Filled 2019-09-24: qty 20

## 2019-09-24 MED ORDER — FENTANYL CITRATE (PF) 100 MCG/2ML IJ SOLN
25.0000 ug | INTRAMUSCULAR | Status: DC | PRN
  Administered 2019-09-25: 100 ug via INTRAVENOUS
  Filled 2019-09-24: qty 2

## 2019-09-24 MED ORDER — HEPARIN SODIUM (PORCINE) 5000 UNIT/ML IJ SOLN
5000.0000 [IU] | Freq: Three times a day (TID) | INTRAMUSCULAR | Status: DC
  Administered 2019-09-24 – 2019-09-29 (×16): 5000 [IU] via SUBCUTANEOUS
  Filled 2019-09-24 (×16): qty 1

## 2019-09-24 NOTE — Progress Notes (Signed)
No sz on prelim EEG review. No need for LTM. MRI brain with and without when able to.  -- Amie Portland, MD [facility]

## 2019-09-24 NOTE — ED Notes (Signed)
This nurse gave report to Wyn Quaker received report. No bed assignment at this time, unable to give report to [facility]

## 2019-09-24 NOTE — Progress Notes (Signed)
STAT EEG completed; results pending. 

## 2019-09-24 NOTE — Consult Note (Signed)
Reason for Consult:Seizure Referring Physician: Erma Heritage  CC: Seizure  HPI: Cameron Hunt is an 58 y.o. male found unresponsive in the road with seizure activity.  Unclear identification.  No information about history.  Was having seizure activity on presentation to the ED.  Received 6mg  of Ativan and required intubation.    Past medical history: Unable to obtain due to mental status  Family history: Unable to obtain due to mental status  Social History:  has no history on file for tobacco, alcohol, and drug.  Not on File  Medications: Unable to obtain due to mental status  ROS: Unable to obtain due to mental status  Physical Examination: Weight 70.9 kg, SpO2 100 %.  HEENT-  Normocephalic, no lesions, without obvious abnormality.  Normal external eye and conjunctiva.  Normal TM's bilaterally.  Normal auditory canals and external ears. Normal external nose, mucus membranes and septum.  Normal pharynx. Cardiovascular- S1, S2 normal, pulses palpable throughout   Lungs- chest clear, no wheezing, rales, normal symmetric air entry Abdomen- soft, non-tender; bowel sounds normal; no masses,  no organomegaly Extremities- no edema Lymph-no adenopathy palpable Musculoskeletal-no joint tenderness, deformity or swelling Skin-abdominal scar  Neurological Examination   Mental Status: Patient does not respond to verbal stimuli.  Does not respond to deep sternal rub.  Does not follow commands.  No verbalizations noted.  Cranial Nerves: II: patient does not respond confrontation bilaterally, pupils right 3 mm, left 3 mm,and reactive bilaterally III,IV,VI: Oculocephalic response present bilaterally. V,VII: corneal reflex present bilaterally  VIII: patient does not respond to verbal stimuli IX,X: gag reflex unable to test, XI: trapezius strength unable to test bilaterally XII: tongue strength unable to test Motor: Some spontaneous movement noted of the LLE, otherwise no movement  noted Sensory: Does not respond to noxious stimuli in any extremity. Deep Tendon Reflexes:  Sustained clonus in both lower extremities Plantars: Mute bilaterally Cerebellar: Unable to perform  Laboratory Studies:   Basic Metabolic Panel: No results for input(s): NA, K, CL, CO2, GLUCOSE, BUN, CREATININE, CALCIUM, MG, PHOS in the last 168 hours.  Liver Function Tests: No results for input(s): AST, ALT, ALKPHOS, BILITOT, PROT, ALBUMIN in the last 168 hours. No results for input(s): LIPASE, AMYLASE in the last 168 hours. No results for input(s): AMMONIA in the last 168 hours.  CBC: Recent Labs  Lab 09/24/19 1550  WBC 13.6*  NEUTROABS 11.2*  HGB 13.6  HCT 40.1  MCV 93.0  PLT 357    Cardiac Enzymes: No results for input(s): CKTOTAL, CKMB, CKMBINDEX, TROPONINI in the last 168 hours.  BNP: Invalid input(s): POCBNP  CBG: Recent Labs  Lab 09/24/19 1533  GLUCAP 124*    Microbiology: No results found for this or any previous visit.  Coagulation Studies: No results for input(s): LABPROT, INR in the last 72 hours.  Urinalysis: No results for input(s): COLORURINE, LABSPEC, PHURINE, GLUCOSEU, HGBUR, BILIRUBINUR, KETONESUR, PROTEINUR, UROBILINOGEN, NITRITE, LEUKOCYTESUR in the last 168 hours.  Invalid input(s): APPERANCEUR  Lipid Panel:  No results found for: CHOL, TRIG, HDL, CHOLHDL, VLDL, LDLCALC  HgbA1C: No results found for: HGBA1C  Urine Drug Screen:  No results found for: LABOPIA, COCAINSCRNUR, LABBENZ, AMPHETMU, THCU, LABBARB  Alcohol Level:  Recent Labs  Lab 09/24/19 1550  ETH <10    Other results: EKG: sinus tachycardia at 115 bpm.  Imaging: Dg Abdomen 1 View  Result Date: 09/24/2019 CLINICAL DATA:  OG tube placement EXAM: ABDOMEN - 1 VIEW COMPARISON:  Concurrent chest radiograph FINDINGS: AP supine radiograph of  the lower chest and upper abdomen demonstrates placement of a transesophageal tube with the tip and side port distal to the GE junction,  terminating left upper quadrant. Bowel gas pattern is normal in nonspecific. Included portions of the lungs are clear aside from mild streaky atelectatic changes. Osseous structures are unremarkable. IMPRESSION: 1. Transesophageal tube tip and side port distal to the GE junction. Electronically Signed   By: Lovena Le M.D.   On: 09/24/2019 16:20   Dg Chest Portable 1 View  Result Date: 09/24/2019 CLINICAL DATA:  Respiratory failure post intubation EXAM: PORTABLE CHEST 1 VIEW COMPARISON:  None FINDINGS: Endotracheal tube terminates in the mid trachea 3.1 cm from the carina. Transesophageal tube tip and side port distal to the GE junction. Streaky opacities in the lungs have an appearance suggestive of atelectasis. No consolidative opacity. No pneumothorax. No effusion. The cardiomediastinal contours are unremarkable. No acute osseous or soft tissue abnormality. IMPRESSION: 1. Endotracheal tube terminates 3.1 cm from the carina. 2. Transesophageal tube tip and side port distal to the GE junction. 3. Streaky opacities in the lungs most suggestive of atelectasis. Electronically Signed   By: Lovena Le M.D.   On: 09/24/2019 16:17     Assessment/Plan: Cameron Hunt with unknown past medical history who presented with status epilepticus after being found down.  Patient now intubated and on Propofol.  Has received Ativan.    Recommendations: 1. Agree with load of Dilantin.  Dilantin level in AM and maintenance at 100mg  IV q 8 hours.  If recurrent seizures despite adequate level would load Keppra 2000mg  IV.   2. Serum magnesium and phosphorus to be obtained with routine lab work and UDS/ETOH level 3. Head CT without contrast.  If unremarkable would perform MRI of the brain once patient more stable 4. Patient unable to have continuous monitoring or routine EEG at this facility.  Recommend transfer to a facility where these services are available.   5. Seizure precautions   Alexis Goodell,  MD Neurology [phone] 09/24/2019, 4:27 PM

## 2019-09-24 NOTE — H&P (Addendum)
NAME:  Cameron Hunt, MRN:  [id number], DOB:  11/04/1873, LOS: 0 ADMISSION DATE:  09/24/2019, CONSULTATION DATE:  09/24/19,  CHIEF COMPLAINT:  seizure  Brief History   Male with unknown identity found unresponsive and overload with seizure-like activity, no obtainable medical history.  He was given Ativan, Versed, and phenytoin in the emergency department and intubated.  Transferred from [facility] to [facility] for continuous EEG.  History of present illness   58 y.o. unidentified M who was found unresponsive and seizing in a road.  Per notes, a bystander noted he might be deaf.  He was brought by EMS to [facility] emergency department and given 6 mg of Ativan, 2 mg Versed, he was seen by neurology and loaded with phenytoin and propofol before cessation of seizure activity.  Initial vital signs were significant for blood pressure of 190/106 with mild tachycardia, no fever.  Lab work showed CO2 20, anion gap 19, leukocytosis 13.6.  Urine drug screen positive for benzodiazepines and cannabinoids.  EtOH level less than 10, work-up otherwise unremarkable.  Head CT was negative for acute findings, CXR with streaky opacities.  Covid-19 was negative.  He was transferred to [facility] and has had no further seizure activity.  Past Medical History  Unknown  Significant Hospital Events   11/20-presented to [facility] ED, intubated transferred to [place]:  Neurology  Procedures:  ETT 11/20- Significant Diagnostic Tests:  11/20 CT head>>no acute abnormalities, areas of encephalomalacia possibly old trauma or old infarcts, chronic microvascular ischemic changes 11/20 CT C-spine>> no acute findings 11/20 CXR>>Streaky opacities in the lungs most suggestive of atelectasis  Micro Data:  11/20 Sars-CoV-2>>negative 11/20 MRSA screen>> 11/20 BCx2>>  Antimicrobials:    Interim history/subjective:  Patient arrived to [facility], ICU, has been sedated with propofol, unresponsive with no further  seizure activity  Objective   Blood pressure 114/80, pulse 87, temperature 99.7 F (37.6 C), resp. rate 13, SpO2 100 %.    Vent Mode: PRVC FiO2 (%):  [40 %] 40 % Set Rate:  [14 bmp-18 bmp] 18 bmp Vt Set:  [500 mL] 500 mL PEEP:  [5 cmH20] 5 cmH20 Plateau Pressure:  [17 cmH20] 17 cmH20  No intake or output data in the 24 hours ending 09/24/19 2017 There were no vitals filed for this visit.  General: Thin, African-American American male, estimated age 5s to 39s?  Sedated and intubated HEENT: MM pink/moist Neuro: Pupils 40mm equal and reactive, normal reflexes, no noted clonus or posturing, unresponsive CV: s1s2 rrr, no m/r/g PULM:  CTAB, ETT tube in place GI: soft, bsx4 active  Extremities: warm/dry, no edema  Skin: no rashes or lesions   Resolved Hospital Problem list     Assessment & Plan:   Seizure activity, possibly status epilepticus with inability to protect airway -Intubated in the ED, loaded with Dilantin -Unknown medical history, consider EtOH withdrawal P: -Continue Dilantin 100 mg q. 8hrs and check Dilantin level in the morning per neurology recommendations -Continuous EEG -Seizure precautions -Repeat lab work, check ammonia level -MRI head -Check blood cultures --Maintain full vent support with SAT/SBT as tolerated -titrate Vent setting to maintain SpO2 greater than or equal to 90%. -HOB elevated 30 degrees. -Plateau pressures less than 30 cm H20.  -Follow chest x-ray, ABG prn.   -Bronchial hygiene and RT/bronchodilator protocol.   Hypertension -initial BP 190/106 - PRES on differential P: -goal reduction 25%, no anti-hypertensive medication indicated at the time of arrival  [place] -Likely secondary to lactic  acid from seizure activity P: -follow repeat metabolic panel -Gentle IVF    Best practice:  Diet: NPO Pain/Anxiety/Delirium protocol (if indicated): Propofol, as needed fentanyl VAP protocol (if indicated): Yes DVT prophylaxis: Heparin GI  prophylaxis: Protonix Glucose control: not indicated Mobility: bed rest Code Status: full Family Communication: no known contacts Disposition: ICU  Labs   CBC: Recent Labs  Lab 09/24/19 1550  WBC 13.6*  NEUTROABS 11.2*  HGB 13.6  HCT 40.1  MCV 93.0  PLT 357    Basic Metabolic Panel: Recent Labs  Lab 09/24/19 1550  NA 140  K 3.5  CL 101  CO2 20*  GLUCOSE 119*  BUN 16  CREATININE 0.85  CALCIUM 9.1  MG 2.1   GFR: Estimated Creatinine Clearance: -3.5 mL/min (by C-G formula based on SCr of 0.85 mg/dL). Recent Labs  Lab 09/24/19 1550  WBC 13.6*    Liver Function Tests: Recent Labs  Lab 09/24/19 1550  AST 40  ALT 18  ALKPHOS 88  BILITOT 0.9  PROT 8.3*  ALBUMIN 4.5   No results for input(s): LIPASE, AMYLASE in the last 168 hours. No results for input(s): AMMONIA in the last 168 hours.  ABG    Component Value Date/Time   PHART 7.40 09/24/2019 1641   PCO2ART 48 09/24/2019 1641   PO2ART 166 (H) 09/24/2019 1641   HCO3 29.7 (H) 09/24/2019 1641   O2SAT 99.5 09/24/2019 1641     Coagulation Profile: No results for input(s): INR, PROTIME in the last 168 hours.  Cardiac Enzymes: Recent Labs  Lab 09/24/19 1550  CKTOTAL 315    HbA1C: No results found for: HGBA1C  CBG: Recent Labs  Lab 09/24/19 1533  GLUCAP 124*    Review of Systems:   Unable to obtain due to encephalopathy  Past Medical History  He,  has no past medical history on file.   Surgical History   No past surgical history on file.   Social History      Family History   His family history is not on file.   Allergies Not on File   Home Medications  Prior to Admission medications   Not on File     Critical care time: 50 minutes      CRITICAL CARE Performed by: Darcella Gasman Ayush Boulet   Total critical care time: 50 minutes  Critical care time was exclusive of separately billable procedures and treating other patients.  Critical care was necessary to treat or prevent  imminent or life-threatening deterioration.  Critical care was time spent personally by me on the following activities: development of treatment plan with patient and/or surrogate as well as nursing, discussions with consultants, evaluation of patient's response to treatment, examination of patient, obtaining history from patient or surrogate, ordering and performing treatments and interventions, ordering and review of laboratory studies, ordering and review of radiographic studies, pulse oximetry and re-evaluation of patient's condition.   Darcella Gasman Patricia Fargo, PA-C [place] [facility]  Pager# [phone], if no answer [phone]

## 2019-09-24 NOTE — Progress Notes (Signed)
eLink Physician-Brief Progress Note Patient Name: Cameron Hunt DOB: 11/04/1873 MRN: [id number]   Date of Service  09/24/2019  HPI/Events of Note  Agitation - Request for bilateral soft wrist restraints  eICU Interventions  Will order: 1. Bilateral soft wrist restraint X 9 hours.      Intervention Category Major Interventions: Delirium, psychosis, severe agitation - evaluation and management  [name] 09/24/2019, 11:23 PM

## 2019-09-24 NOTE — Procedures (Addendum)
Patient Name: [name]  MRN: [id number]  Epilepsy Attending: Lora Havens  Referring Physician/Provider: Dr Amie Portland Date: 09/24/2019  Duration: 23.47 mins  Patient history: Unknown age male with seizure disorder. EEG to evaluate for seizures  Level of alertness: comatose/sedated  AEDs during EEG study: Propofol, keppra, PHT  Technical aspects: This EEG study was done with scalp electrodes positioned according to the 10-20 International system of electrode placement. Electrical activity was acquired at a sampling rate of 500Hz  and reviewed with a high frequency filter of 70Hz  and a low frequency filter of 1Hz . EEG data were recorded continuously and digitally stored.   DESCRIPTION: EEG showed an excessive amount of 15 to 18 Hz, 2-3 uV beta activity with irregular morphology distributed symmetrically and diffusely. Hyperventilation and photic stimulation were not performed.  ABNORMALITY - Excessive fast, generalized  IMPRESSION: This study is suggestive of severe diffuse encephalopathy, non specific to etiology, could be secondary to sedated state.  No seizures or epileptiform discharges were seen throughout the recording.  The excessive beta activity seen in the background is most likely due to the effect of medications like benzodiazepine and is a benign EEG pattern.  Tucker Minter Barbra Sarks

## 2019-09-24 NOTE — ED Triage Notes (Signed)
Pt arrived via [facility]. Pt found in middle of road unresponsive and seizing. EMS states pt is deaf and unable to follow commands. EMS gave 2 versed.

## 2019-09-24 NOTE — ED Provider Notes (Signed)
[facility] Emergency Department Provider Note  ____________________________________________   First MD Initiated Contact with Patient 09/24/19 1526     (approximate)  I have reviewed the triage vital signs and the nursing notes.   HISTORY  Chief Complaint Seizures    HPI Cameron Hunt is a 58 y.o. male  Here with AMS. Pt arrives as a Cameron Hunt, was reportedly found down, in the street, by a bystander. No known history though someone at the scene says he "may be deaf," but didn't know him personally. Per EMS report, pt was found with GTC seizure activity, unresponsive. He has been tachycardic, hypertensive en route. Received 2 versed IV. Has not become responsive yet.   Level 5 caveat invoked as remainder of history, ROS, and physical exam limited due to patient's AMS.         History reviewed. No pertinent past medical history.  There are no active problems to display for this patient.    Prior to Admission medications   Not on File    Allergies Patient has no allergy information on record.  History reviewed. No pertinent family history.  Social History Social History   Tobacco Use   Smoking status: Not on file  Substance Use Topics   Alcohol use: Not on file   Drug use: Not on file    Review of Systems  Review of Systems  Unable to perform ROS: Mental status change     ____________________________________________  PHYSICAL EXAM:      VITAL SIGNS: ED Triage Vitals [09/24/19 1551]  Enc Vitals Group     BP      Pulse      Resp      Temp      Temp src      SpO2      Weight 156 lb 4.8 oz (70.9 kg)     Height      Head Circumference      Peak Flow      Pain Score      Pain Loc      Pain Edu?      Excl. in [place]?      Physical Exam Constitutional:      Comments: Disheveled, poor dentition, shaking uncontrollably on arrival, unresponsive  HENT:     Head:     Comments: Markedly poor dentition with multiple  caries and loose, fractured teeth. Lateral tongue abrasions w/ small volume bleeding. Teeth clenched. No obvious head trauma.    Right Ear: Tympanic membrane normal.     Left Ear: Tympanic membrane normal.     Mouth/Throat:     Mouth: Mucous membranes are moist.  Eyes:     Comments: Rightward gaze deviation, pupils reactive b/l. Does not blink to threat or orient to light or examiner.  Neck:     Musculoskeletal: Neck supple.     Comments: No apparent trauma, trachea midline Cardiovascular:     Rate and Rhythm: Tachycardia present.     Heart sounds: No murmur. No friction rub.  Pulmonary:     Comments: Sonorous resp with transmitted upper airway sounds.  Abdominal:     General: Abdomen is flat.  Skin:    General: Skin is dry.     Capillary Refill: Capillary refill takes less than 2 seconds.  Neurological:     Mental Status: He is unresponsive.     GCS: GCS eye subscore is 1. GCS verbal subscore is 1. GCS motor subscore is 1.  Comments: Face appears symmetric. Jaw clenched. Eyes deviated right, does not blink to threat. Gag diminished. Bilateral increased tone rhythmic tonic-clonic shaking of upper and lower ext, flexed at elbows. No response to painful stimuli b/l UE and LE.       ____________________________________________   LABS (all labs ordered are listed, but only abnormal results are displayed)  Labs Reviewed  GLUCOSE, CAPILLARY - Abnormal; Notable for the following components:      Result Value   Glucose-Capillary 124 (*)    All other components within normal limits  CBC WITH DIFFERENTIAL/PLATELET - Abnormal; Notable for the following components:   WBC 13.6 (*)    Neutro Abs 11.2 (*)    All other components within normal limits  COMPREHENSIVE METABOLIC PANEL - Abnormal; Notable for the following components:   CO2 20 (*)    Glucose, Bld 119 (*)    Total Protein 8.3 (*)    GFR calc non Af Amer 53 (*)    Anion gap 19 (*)    All other components within normal  limits  URINE DRUG SCREEN, QUALITATIVE ([facility] ONLY) - Abnormal; Notable for the following components:   Cannabinoid 50 Ng, Ur [place] POSITIVE (*)    Benzodiazepine, Ur Scrn POSITIVE (*)    All other components within normal limits  URINALYSIS, COMPLETE (UACMP) WITH MICROSCOPIC - Abnormal; Notable for the following components:   Color, Urine AMBER (*)    APPearance TURBID (*)    Hgb urine dipstick LARGE (*)    Protein, ur 100 (*)    Bacteria, UA RARE (*)    All other components within normal limits  ACETAMINOPHEN LEVEL - Abnormal; Notable for the following components:   Acetaminophen (Tylenol), Serum <10 (*)    All other components within normal limits  BLOOD GAS, ARTERIAL - Abnormal; Notable for the following components:   pO2, Arterial 166 (*)    Bicarbonate 29.7 (*)    Acid-Base Excess 4.0 (*)    All other components within normal limits  SARS CORONAVIRUS 2 BY RT PCR (HOSPITAL ORDER, [place] [facility])  MAGNESIUM  ETHANOL  CK  SALICYLATE LEVEL  TSH  TRIGLYCERIDES  POC SARS CORONAVIRUS 2 AG -  ED  POC SARS CORONAVIRUS 2 AG    ____________________________________________  EKG: Sinus tachycardia, VR 115. PR 131, QRS 81, QTc 440. No acute sT changes. No EKG evidence of acute ischemia or infarct. ________________________________________  RADIOLOGY All imaging, including plain films, CT scans, and ultrasounds, independently reviewed by me, and interpretations confirmed via formal radiology reads.  ED MD interpretation:   CXR: Atelectasis, ETT in place KUB: NG tube past stomach CT Head; Negative  Official radiology report(s): Dg Abdomen 1 View  Result Date: 09/24/2019 CLINICAL DATA:  OG tube placement EXAM: ABDOMEN - 1 VIEW COMPARISON:  Concurrent chest radiograph FINDINGS: AP supine radiograph of the lower chest and upper abdomen demonstrates placement of a transesophageal tube with the tip and side port distal to the GE junction, terminating left upper  quadrant. Bowel gas pattern is normal in nonspecific. Included portions of the lungs are clear aside from mild streaky atelectatic changes. Osseous structures are unremarkable. IMPRESSION: 1. Transesophageal tube tip and side port distal to the GE junction. Electronically Signed   By: Lovena Le M.D.   On: 09/24/2019 16:20   Ct Head Wo Contrast  Result Date: 09/24/2019 CLINICAL DATA:  Patient found in the middle of the road unresponsive and seizing. EXAM: CT HEAD WITHOUT CONTRAST CT CERVICAL  SPINE WITHOUT CONTRAST TECHNIQUE: Multidetector CT imaging of the head and cervical spine was performed following the standard protocol without intravenous contrast. Multiplanar CT image reconstructions of the cervical spine were also generated. COMPARISON:  None. FINDINGS: CT HEAD FINDINGS Brain: No evidence of acute infarction, hemorrhage, hydrocephalus, extra-axial collection or mass lesion/mass effect. 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 No other small infarct noted along the posterolateral left frontal lobe with a third area noted along the anterior inferior left frontal lobe, the latter area likely due to remote trauma. White matter hypoattenuation is also present bilaterally consistent with mild to moderate chronic microvascular ischemic change. Vascular: No hyperdense vessel or unexpected calcification. Skull: Normal. Negative for fracture or focal lesion. Sinuses/Orbits: Old medial right orbital wall fracture. Minor maxillary and mild ethmoid sinus mucosal thickening. Other: None. CT CERVICAL SPINE FINDINGS Alignment: Normal. Skull base and vertebrae: No acute fracture. No primary bone lesion or focal pathologic process. Soft tissues and spinal canal: No prevertebral fluid or swelling. No visible canal hematoma. Disc levels: Mild loss of disc height at C4-C5, C5-C6 and C6-C7. Small anterior endplate spurs are noted from C2 through C7. No convincing  disc herniation. Upper chest: No acute findings. Scarring noted at the lung apices. There is a nasogastric and endotracheal tube, partly imaged. Other: None. IMPRESSION: HEAD CT 1. No acute intracranial abnormalities. 2. Areas of encephalomalacia that are likely due to old trauma, or possibly old infarcts or a combination. Chronic microvascular ischemic change. 3. No skull fracture. CERVICAL CT 1. No fracture or acute finding. Electronically Signed   By: Lajean Manes M.D.   On: 09/24/2019 17:48   Ct Cervical Spine Wo Contrast  Result Date: 09/24/2019 CLINICAL DATA:  Patient found in the middle of the road unresponsive and seizing. EXAM: CT HEAD WITHOUT CONTRAST CT CERVICAL SPINE WITHOUT CONTRAST TECHNIQUE: Multidetector CT imaging of the head and cervical spine was performed following the standard protocol without intravenous contrast. Multiplanar CT image reconstructions of the cervical spine were also generated. COMPARISON:  None. FINDINGS: CT HEAD FINDINGS Brain: No evidence of acute infarction, hemorrhage, hydrocephalus, extra-axial collection or mass lesion/mass effect. 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 No other small infarct noted along the posterolateral left frontal lobe with a third area noted along the anterior inferior left frontal lobe, the latter area likely due to remote trauma. White matter hypoattenuation is also present bilaterally consistent with mild to moderate chronic microvascular ischemic change. Vascular: No hyperdense vessel or unexpected calcification. Skull: Normal. Negative for fracture or focal lesion. Sinuses/Orbits: Old medial right orbital wall fracture. Minor maxillary and mild ethmoid sinus mucosal thickening. Other: None. CT CERVICAL SPINE FINDINGS Alignment: Normal. Skull base and vertebrae: No acute fracture. No primary bone lesion or focal pathologic process. Soft tissues and spinal canal: No prevertebral  fluid or swelling. No visible canal hematoma. Disc levels: Mild loss of disc height at C4-C5, C5-C6 and C6-C7. Small anterior endplate spurs are noted from C2 through C7. No convincing disc herniation. Upper chest: No acute findings. Scarring noted at the lung apices. There is a nasogastric and endotracheal tube, partly imaged. Other: None. IMPRESSION: HEAD CT 1. No acute intracranial abnormalities. 2. Areas of encephalomalacia that are likely due to old trauma, or possibly old infarcts or a combination. Chronic microvascular ischemic change. 3. No skull fracture. CERVICAL CT 1. No fracture or acute finding. Electronically Signed   By: Shanon Brow  Ormond M.D.   On: 09/24/2019 17:48   Dg Chest Portable 1 View  Result Date: 09/24/2019 CLINICAL DATA:  Respiratory failure post intubation EXAM: PORTABLE CHEST 1 VIEW COMPARISON:  None FINDINGS: Endotracheal tube terminates in the mid trachea 3.1 cm from the carina. Transesophageal tube tip and side port distal to the GE junction. Streaky opacities in the lungs have an appearance suggestive of atelectasis. No consolidative opacity. No pneumothorax. No effusion. The cardiomediastinal contours are unremarkable. No acute osseous or soft tissue abnormality. IMPRESSION: 1. Endotracheal tube terminates 3.1 cm from the carina. 2. Transesophageal tube tip and side port distal to the GE junction. 3. Streaky opacities in the lungs most suggestive of atelectasis. Electronically Signed   By: Lovena Le M.D.   On: 09/24/2019 16:17    ____________________________________________  PROCEDURES   Procedure(s) performed (including Critical Care):  .Critical Care Performed by: Duffy Bruce, MD Authorized by: Duffy Bruce, MD   Critical care provider statement:    Critical care time (minutes):  75   Critical care time was exclusive of:  Separately billable procedures and treating other patients and teaching time   Critical care was necessary to treat or prevent  imminent or life-threatening deterioration of the following conditions:  Cardiac failure, circulatory failure and CNS failure or compromise   Critical care was time spent personally by me on the following activities:  Development of treatment plan with patient or surrogate, discussions with consultants, evaluation of patient's response to treatment, examination of patient, obtaining history from patient or surrogate, ordering and performing treatments and interventions, ordering and review of laboratory studies, ordering and review of radiographic studies, pulse oximetry, re-evaluation of patient's condition and review of old charts   I assumed direction of critical care for this patient from another provider in my specialty: no   Procedure Name: Intubation Date/Time: 09/24/2019 4:39 PM Performed by: Duffy Bruce, MD Pre-anesthesia Checklist: Patient identified, Patient being monitored, Emergency Drugs available, Timeout performed and Suction available Oxygen Delivery Method: Non-rebreather mask Preoxygenation: Pre-oxygenation with 100% oxygen Induction Type: Rapid sequence Ventilation: Mask ventilation without difficulty Laryngoscope Size: Mac and 3 Grade View: Grade II Tube size: 7.5 mm Number of attempts: 1 Airway Equipment and Method: Video-laryngoscopy and Rigid stylet Placement Confirmation: ETT inserted through vocal cords under direct vision,  CO2 detector and Breath sounds checked- equal and bilateral Secured at: 25 cm Tube secured with: Tape Dental Injury: Teeth and Oropharynx as per pre-operative assessment  Difficulty Due To: Difficulty was unanticipated Future Recommendations: Recommend- induction with short-acting agent, and alternative techniques readily available     IV Placement Left EJ placed by myself, 20g IV, due to difficulty obtaining access. Tolerated well with good blood return.  ____________________________________________  INITIAL IMPRESSION / MDM /  ASSESSMENT AND PLAN / ED COURSE  As part of my medical decision making, I reviewed the following data within the [place] notes reviewed and incorporated, Old chart reviewed, Notes from prior ED visits, and [place] Controlled Substance Database       *Cameron Hunt was evaluated in Emergency Department on 09/24/2019 for the symptoms described in the history of present illness. He was evaluated in the context of the global COVID-19 pandemic, which necessitated consideration that the patient might be at risk for infection with the SARS-CoV-2 virus that causes COVID-19. Institutional protocols and algorithms that pertain to the evaluation of patients at risk for COVID-19 are in a state of rapid change based on information released by regulatory bodies  including the CDC and federal and state organizations. These policies and algorithms were followed during the patient's care in the ED.  Some ED evaluations and interventions may be delayed as a result of limited staffing during the pandemic.*     Medical Decision Making:  Unknown age male here with AMS and seizure activity. On arrival, pt tachycardic, hypertensive, seizing with R gaze deviation, whole body tonic-clonic activity. No response to Ativan 2 mg x 3, Versed 2 mg. Pt subsequently intubated. Loaded w/ phenytoin 20 mg/kg and on propofol 40 prior to cessation of activity. Dr. Doy Mince in to see. Will check stat CT head. Suspect status epilepticus, consideration of EtOH w/d given vitals despite cessation of seizure activity. CT pending. Will need to be transferred for continous EEG monitoring.   Pt with resolution of seizure like activity on propofol after phenytoin load. HR now 100s, BP 140-150/90s. Admit to ICU at [facility]. CT head neg.   ____________________________________________  FINAL CLINICAL IMPRESSION(S) / ED DIAGNOSES  Final diagnoses:  Status epilepticus ([place])  Acute respiratory failure with hypoxia ([place])       MEDICATIONS GIVEN DURING THIS VISIT:  Medications  LORazepam (ATIVAN) 2 MG/ML injection (has no administration in time range)  propofol (DIPRIVAN) 1000 MG/100ML infusion (60 mcg/kg/min  70.9 kg Intravenous Rate/Dose Change 09/24/19 1633)  fentaNYL (SUBLIMAZE) injection 100 mcg (100 mcg Intravenous Given 09/24/19 1614)  fentaNYL (SUBLIMAZE) injection 100 mcg (has no administration in time range)  LORazepam (ATIVAN) 2 MG/ML injection (has no administration in time range)  thiamine (B-1) injection 100 mg (has no administration in time range)  folic acid injection 1 mg (has no administration in time range)  etomidate (AMIDATE) injection (30 mg Intravenous Given 09/24/19 1544)  succinylcholine (ANECTINE) injection (100 mg Intravenous Given 09/24/19 1544)  sodium chloride 0.9 % bolus 1,000 mL (0 mLs Intravenous Stopped 09/24/19 1728)  propofol (DIPRIVAN) 1000 MG/100ML infusion (20 mcg/kg/min  70.9 kg Intravenous New Bag/Given 09/24/19 1552)  phenytoin (DILANTIN) 1,400 mg in sodium chloride 0.9 % 250 mL IVPB (0 mg Intravenous Stopped 09/24/19 1727)     ED Discharge Orders    None       Note:  This document was prepared using Dragon voice recognition software and may include unintentional dictation errors.   Duffy Bruce, MD 09/24/19 [phone]

## 2019-09-24 NOTE — Consult Note (Signed)
Neurology Consultation  Reason for Consult: status epilepticus Referring Physician:  Dr Verlee Monte  CC: unresponsive with witnessed seizure at the side of the road  History is obtained from: Chart  HPI: Cameron Hunt is a 58 y.o. male  Cameron Hunt is a patient with unknown demographics, African-American man, middle-aged, with unknown past medical history, brought in the [facility] after he was found on the side of the road unresponsive and seizing and EMS was called by bystanders. There is some report by EMS that the patient might be deaf at baseline. He was seen and evaluated by neurology at [facility].  Intubated for airway protection.  Fosphenytoin load administered around 4:35 PM, started on propofol and transferred to [facility] for further evaluation. Neurology called by critical care team for further management. No history of friends or family on the chart.  Patient unable to provide any history.  LKW: Unknown tpa given?: no, unknown last known well, unknown patient demographics Premorbid modified Rankin scale (mRS): Unable to ascertain ROS: Unable to obtain due to altered mental status.   No past medical history on file. Unable to ascertain due to patient's mentation  No family history on file. Unable to and due to patient's mentation  Social History:   has no history on file for tobacco, alcohol, and drug. Unable to ascertain due to patient's mentation Medications  Current Facility-Administered Medications:  .  fentaNYL (SUBLIMAZE) injection 25 mcg, 25 mcg, Intravenous, Q15 min PRN, Gleason, Otilio Carpen, PA-C .  fentaNYL (SUBLIMAZE) injection 25-100 mcg, 25-100 mcg, Intravenous, Q30 min PRN, Gleason, Otilio Carpen, PA-C .  heparin injection 5,000 Units, 5,000 Units, Subcutaneous, Q8H, Gleason, Otilio Carpen, PA-C .  lactated ringers infusion, , Intravenous, Continuous, Gleason, Otilio Carpen, PA-C .  pantoprazole (PROTONIX) injection 40 mg, 40 mg,  Intravenous, Q24H, Gleason, Otilio Carpen, PA-C .  phenytoin (DILANTIN) injection 100 mg, 100 mg, Intravenous, Q8H, Gleason, Otilio Carpen, PA-C .  propofol (DIPRIVAN) 1000 MG/100ML infusion, 0-50 mcg/kg/min, Intravenous, Continuous, Gleason, Otilio Carpen, PA-C .  thiamine (B-1) injection 100 mg, 100 mg, Intravenous, Daily, Verlee Monte, Hortencia Conradi, MD  Exam: Current vital signs: BP 123/88   Pulse 81   Temp 99.5 F (37.5 C)   Resp 19   SpO2 99%  Vital signs in last 24 hours: Temp:  [99.2 F (37.3 C)-99.8 F (37.7 C)] 99.5 F (37.5 C) (11/20 2030) Pulse Rate:  [81-109] 81 (11/20 2030) Resp:  [13-40] 19 (11/20 2030) BP: (114-192)/(80-115) 123/88 (11/20 2030) SpO2:  [94 %-100 %] 99 % (11/20 2030) FiO2 (%):  [40 %] 40 % (11/20 1940) Weight:  [70.9 kg] 70.9 kg (11/20 1551) General: Sedated on propofol, intubated.  Propofol held for exam. HEENT: Cephalic atraumatic Lungs: Clear to auscultation, vented CVS: Slightly tachycardic but regular Abdomen: Nondistended nontender Extremities: Dark nails, dirty skin on soles, no edema. Neurological exam Sedated intubated sedation held for exam. No spontaneous movements but as soon as sedation with propofol was held, had whole-body shivering that continued for a few minutes and stopped with initiation of propofol. Cranial nerves: Pupils are equal round reactive to light, gaze is disconjugate, does not blink to threat from either side, difficult ascertain facial symmetry. Motor exam: No spontaneous movements other than the shivering noted after holding sedation.  On noxious stimulation, withdraws both right and lower extremities.  On noxious stimulation, localizes with the left upper extremity but not with the right upper extremity. Sensory exam: As above Coordination cannot be assessed  NIH  stroke scale 1a Level of Conscious.: 2 1b LOC Questions: 2 1c LOC Commands: 2 2 Best Gaze: 1 3 Visual: 0 4 Facial Palsy: 0 5a Motor Arm - left: 2 5b Motor Arm - Right: 2 6a  Motor Leg - Left: 2 6b Motor Leg - Right: 4 7 Limb Ataxia: 0 8 Sensory: 0 9 Best Language: 3 10 Dysarthria: un 11 Extinct. and Inatten.: 0 TOTAL: 20 UN  GCS: at best 7 ([id number]) Labs I have reviewed labs in epic and the results pertinent to this consultation are: Leukocytosis white count 16.2, urinary toxicology screen positive for benzodiazepines and THC. CBC    Component Value Date/Time   WBC 16.2 (H) 09/24/2019 2002   RBC 4.24 09/24/2019 2002   HGB 13.6 09/24/2019 2002   HCT 39.9 09/24/2019 2002   PLT 301 09/24/2019 2002   MCV 94.1 09/24/2019 2002   MCH 32.1 09/24/2019 2002   MCHC 34.1 09/24/2019 2002   RDW 14.9 09/24/2019 2002   LYMPHSABS 1.3 09/24/2019 1550   MONOABS 0.9 09/24/2019 1550   EOSABS 0.0 09/24/2019 1550   BASOSABS 0.1 09/24/2019 1550    CMP     Component Value Date/Time   NA 139 09/24/2019 2002   K 3.9 09/24/2019 2002   CL 104 09/24/2019 2002   CO2 23 09/24/2019 2002   GLUCOSE 109 (H) 09/24/2019 2002   BUN 11 09/24/2019 2002   CREATININE 0.80 09/24/2019 2002   CALCIUM 8.9 09/24/2019 2002   PROT 7.0 09/24/2019 2002   ALBUMIN 3.8 09/24/2019 2002   AST 40 09/24/2019 2002   ALT 20 09/24/2019 2002   ALKPHOS 87 09/24/2019 2002   BILITOT 0.9 09/24/2019 2002   GFRNONAA 54 (L) 09/24/2019 2002   GFRAA >60 09/24/2019 2002  UA negative for UTI Chest x-ray with some atelectasis Covid test negative  Imaging I have reviewed the images obtained:  CT-scan of the brain-chronic white matter changes.  No bleed.  Question subtle hypodensity in the left frontoparietal region.  Formal reading with no hypodensities.  Assessment: Patient with no known details, found at the side of the road unresponsive and seizing, requiring intubation for airway protection, fosphenytoin and benzodiazepines at the outside hospital, transferred to [facility] further work-up. On examination, he appears to be weaker on the right side based on his response to noxious  stimulation. CT head although read formally as no acute changes, might show subtle left frontal hypodensity. Upon lowering sedation, he did have generalized body shivering/tremulousness-unclear if this was a seizure. Best GCS 7 (comatose) Possible status epilepticus, seizures, broad differential for the unresponsiveness.  Impression Evaluate for status epilepticus Broad differential for unresponsiveness including seizures, status epilepticus, alcohol withdrawal seizures, toxic metabolic encephalopathy. Brought in comatose  Recommendations: -Stat EEG-technologist notified -Already received phenytoin at the outside hospital-start phenytoin 100 3 times daily after checking levels. -Load with Keppra 2 g IV now and Keppra 500 twice daily. -Once the EEG is done, will determine the need for long-term EEG.  If long-term EEG is not required, will obtain MRI of the brain w+w/o contrast as next up. -Continue the efforts to obtain any information on the patient or family per primary team as you are. We will continue to follow with you.  -- Milon DikesAshish Mohd. Derflinger, MD [facility] Pager: [phone] If 7pm to 7am, please call on call as listed on AMION.  CRITICAL CARE ATTESTATION Performed by: Milon DikesAshish Hades Mathew, MD Total critical care time: 40 minutes Critical care time was exclusive of separately billable  procedures and treating other patients and/or supervising APPs/Residents/Students Critical care was necessary to treat or prevent imminent or life-threatening deterioration due to this epilepticus,coma This patient is critically ill and at significant risk for neurological worsening and/or death and care requires constant monitoring. Critical care was time spent personally by me on the following activities: development of treatment plan with patient and/or surrogate as well as nursing, discussions with consultants, evaluation of patient's response to treatment, examination of patient, obtaining history  from patient or surrogate, ordering and performing treatments and interventions, ordering and review of laboratory studies, ordering and review of radiographic studies, pulse oximetry, re-evaluation of patient's condition, participation in multidisciplinary rounds and medical decision making of high complexity in the care of this patient.

## 2019-09-25 ENCOUNTER — Inpatient Hospital Stay (HOSPITAL_COMMUNITY): Payer: Medicaid Other

## 2019-09-25 LAB — CBC
HCT: 36.2 % — ABNORMAL LOW (ref 39.0–52.0)
Hemoglobin: 12.3 g/dL — ABNORMAL LOW (ref 13.0–17.0)
MCH: 32.5 pg (ref 26.0–34.0)
MCHC: 34 g/dL (ref 30.0–36.0)
MCV: 95.8 fL (ref 80.0–100.0)
Platelets: 270 10*3/uL (ref 150–400)
RBC: 3.78 MIL/uL — ABNORMAL LOW (ref 4.22–5.81)
RDW: 14.9 % (ref 11.5–15.5)
WBC: 13.6 10*3/uL — ABNORMAL HIGH (ref 4.0–10.5)
nRBC: 0 % (ref 0.0–0.2)

## 2019-09-25 LAB — PHENYTOIN LEVEL, TOTAL: Phenytoin Lvl: 22.8 ug/mL — ABNORMAL HIGH (ref 10.0–20.0)

## 2019-09-25 LAB — TRIGLYCERIDES: Triglycerides: 117 mg/dL (ref <150)

## 2019-09-25 LAB — AMMONIA: Ammonia: 42 umol/L — ABNORMAL HIGH (ref 9–35)

## 2019-09-25 MED ORDER — CHLORHEXIDINE GLUCONATE CLOTH 2 % EX PADS
6.0000 | MEDICATED_PAD | Freq: Every day | CUTANEOUS | Status: DC
  Administered 2019-09-26 – 2019-09-30 (×5): 6 via TOPICAL

## 2019-09-25 MED ORDER — LEVETIRACETAM 100 MG/ML PO SOLN
500.0000 mg | Freq: Two times a day (BID) | ORAL | Status: DC
  Administered 2019-09-25: 500 mg
  Filled 2019-09-25 (×2): qty 5

## 2019-09-25 MED ORDER — GADOBUTROL 1 MMOL/ML IV SOLN
6.0000 mL | Freq: Once | INTRAVENOUS | Status: AC | PRN
  Administered 2019-09-25: 6 mL via INTRAVENOUS

## 2019-09-25 MED ORDER — ORAL CARE MOUTH RINSE
15.0000 mL | Freq: Two times a day (BID) | OROMUCOSAL | Status: DC
  Administered 2019-09-26 – 2019-09-29 (×7): 15 mL via OROMUCOSAL

## 2019-09-25 MED ORDER — LEVETIRACETAM IN NACL 500 MG/100ML IV SOLN
500.0000 mg | Freq: Two times a day (BID) | INTRAVENOUS | Status: DC
  Administered 2019-09-25 – 2019-09-28 (×6): 500 mg via INTRAVENOUS
  Filled 2019-09-25 (×6): qty 100

## 2019-09-25 MED ORDER — CHLORHEXIDINE GLUCONATE 0.12 % MT SOLN
15.0000 mL | Freq: Two times a day (BID) | OROMUCOSAL | Status: DC
  Administered 2019-09-25 – 2019-09-30 (×8): 15 mL via OROMUCOSAL
  Filled 2019-09-25 (×6): qty 15

## 2019-09-25 NOTE — Progress Notes (Signed)
eLink Physician-Brief Progress Note Patient Name: Cameron Hunt DOB: 11/04/1873 MRN: [id number]   Date of Service  09/25/2019  HPI/Events of Note  Keppra ordered per tube. Patient is NPO and has no enteral tube.   eICU Interventions  Will order: 1. D/C Keppra per tube.  2. Keppra 500 mg IV Q 12 hours.     Intervention Category Major Interventions: Other:  Sommer,Steven Cornelia Copa 09/25/2019, 9:08 PM

## 2019-09-25 NOTE — Procedures (Signed)
Extubation Procedure Note  Patient Details:   Name: [name] DOB: 11/04/1873 MRN: [id number]   Airway Documentation:    Vent end date: 09/25/19 Vent end time: 1550   Evaluation  O2 sats: stable throughout Complications: No apparent complications Patient did tolerate procedure well. Bilateral Breath Sounds: Clear, Diminished   No   RT extubated patient to 4L [place] per MD order with RN at bedside. Patient tolerated well and can cough. Positive cuff leak noted. No stridor noted. RT will continue to monitor as needed.   Vernona Rieger 09/25/2019, 3:57 PM

## 2019-09-25 NOTE — Progress Notes (Signed)
Pt transported to/from MRI via vent w/ no apparent complications.  VSS t/o.

## 2019-09-25 NOTE — Progress Notes (Signed)
eLink Physician-Brief Progress Note Patient Name: Cameron Hunt DOB: 11/04/1873 MRN: [id number]   Date of Service  09/25/2019  HPI/Events of Note  Agitation - Request for soft waist belt.   eICU Interventions  Will order soft waist belt X 12 hours.      Intervention Category Major Interventions: Delirium, psychosis, severe agitation - evaluation and management  Cameron Hunt 09/25/2019, 8:14 PM

## 2019-09-25 NOTE — Progress Notes (Signed)
NAME:  Cameron Hunt, MRN:  [id number], DOB:  11/04/1873, LOS: 1 ADMISSION DATE:  09/24/2019, CONSULTATION DATE:  09/24/2019 CHIEF COMPLAINT:  Seizure/AMS   Brief History   Male with unknown identity found unresponsive and overload with seizure-like activity, no obtainable medical history.  He was given Ativan, Versed, and phenytoin in the emergency department and intubated.  Transferred from [facility] to [facility] for continuous EEG.  History of present illness   [name], age and identity unknown who was found unresponsive and seizing in a road.  Per notes, a bystander noted he might be deaf.  He was brought by EMS to [facility] emergency department and given 6 mg of Ativan, 2 mg Versed, he was seen by neurology and loaded with phenytoin and propofol before cessation of seizure activity.  Initial vital signs were significant for blood pressure of 190/106 with mild tachycardia, no fever.  Lab work showed CO2 20, anion gap 19, leukocytosis 13.6.  Urine drug screen positive for benzodiazepines and cannabinoids.  EtOH level less than 10, work-up otherwise unremarkable.  Head CT was negative for acute findings, CXR with streaky opacities.  Covid-19 was negative.  He was transferred to [facility] and has had no further seizure activity.  Past Medical History  unknown  Significant Hospital Events   11/20>>intubated, transferred to [facility]  Consults:  neurology  Procedures:  EEG 11/20 ETT 11/20   Significant Diagnostic Tests:  11/21 MRI brain-PENDING 11/20 CT head>>no acute abnormalities, areas of encephalomalacia possibly old trauma or old infarcts, chronic microvascular ischemic changes 11/20 CT C-spine>> no acute findings 11/20 CXR>>Streaky opacities in the lungs most suggestive of atelectasis  Micro Data:  11/20 Sars-CoV-2>>negative 11/20 MRSA screen>> 11/20 BCx2>>  Antimicrobials:  none   Interim history/subjective:  No further apparent seizure-like activity; when weaned off  propofol agitated but not following commands  Objective   Blood pressure (!) 144/95, pulse 74, temperature 99.3 F (37.4 C), resp. rate 14, weight 63.2 kg, SpO2 100 %.    Vent Mode: PRVC FiO2 (%):  [40 %] 40 % Set Rate:  [14 bmp-18 bmp] 14 bmp Vt Set:  [500 mL-580 mL] 580 mL PEEP:  [5 cmH20] 5 cmH20 Plateau Pressure:  [17 cmH20-19 cmH20] 17 cmH20   Intake/Output Summary (Last 24 hours) at 09/25/2019 1013 Last data filed at 09/25/2019 1000 Gross per 24 hour  Intake 1335.76 ml  Output 740 ml  Net 595.76 ml   Filed Weights   09/25/19 0500  Weight: 63.2 kg    Examination: General: NAD HENT: AT/[place], ETT in place Lungs: CTA Cardiovascular: RR, tachy, no r/m/g Abdomen: SND Extremities: no c/c/e, moves all spontaneously but not following commands Neuro: CNII-XII grossly intact, no motor deficits, but not following commands GU: deferred  Resolved Hospital Problem list     Assessment & Plan:  Seizure activity, possibly status epilepticus with inability to protect airway -Intubated in the ED, loaded with Dilantin -Unknown medical history, consider EtOH withdrawal P: -Continue Dilantin 100 mg q. 8hrs and check Dilantin level in the morning per neurology recommendations -Seizure precautions -Repeat lab work, ammonia level -MRI brain pending -Check blood cultures  --Maintain full vent support with SAT/SBT as tolerated -titrate Vent setting to maintain SpO2 greater than or equal to 90%. -HOB elevated 30 degrees. -Plateau pressures less than 30 cm H20.  -Follow chest x-ray, ABGprn.  -Bronchial hygiene and RT/bronchodilator protocol. -depending on mental status and SBT, may consider extubation after MRI today (pt quite dynamic, unlikely to stay still  for study unless sedated).   Hypertension -initial BP 190/106 - PRES on differential P: -goal reduction 25%, no anti-hypertensive medication indicated at the time of arrival  [place] -Likely secondary to lactic acid from  seizure activity P: -follow repeat metabolic panel -Gentle IVF  Best practice:  Diet: NPO for now Pain/Anxiety/Delirium protocol (if indicated): CIWA protocol VAP protocol (if indicated): HOB >30, pulm hygiene DVT prophylaxis: SCDs GI prophylaxis: PPI Glucose control: SSI Mobility: bed Code Status: full Family Communication: no known family at this time Disposition: ICU  Labs   CBC: Recent Labs  Lab 09/24/19 1550 09/24/19 2002 09/25/19 0713  WBC 13.6* 16.2* 13.6*  NEUTROABS 11.2*  --   --   HGB 13.6 13.6 12.3*  HCT 40.1 39.9 36.2*  MCV 93.0 94.1 95.8  PLT 357 301 353    Basic Metabolic Panel: Recent Labs  Lab 09/24/19 1550 09/24/19 2002 09/25/19 0527  NA 140 139 139  K 3.5 3.9 3.6  CL 101 104 104  CO2 20* 23 24  GLUCOSE 119* 109* 81  BUN 16 11 8   CREATININE 0.85 0.80 0.82  CALCIUM 9.1 8.9 8.8*  MG 2.1  --   --    GFR: Estimated Creatinine Clearance: -3.2 mL/min (by C-G formula based on SCr of 0.82 mg/dL). Recent Labs  Lab 09/24/19 1550 09/24/19 2002 09/25/19 0713  WBC 13.6* 16.2* 13.6*    Liver Function Tests: Recent Labs  Lab 09/24/19 1550 09/24/19 2002  AST 40 40  ALT 18 20  ALKPHOS 88 87  BILITOT 0.9 0.9  PROT 8.3* 7.0  ALBUMIN 4.5 3.8   No results for input(s): LIPASE, AMYLASE in the last 168 hours. Recent Labs  Lab 09/24/19 2027  AMMONIA 47*    ABG    Component Value Date/Time   PHART 7.40 09/24/2019 1641   PCO2ART 48 09/24/2019 1641   PO2ART 166 (H) 09/24/2019 1641   HCO3 29.7 (H) 09/24/2019 1641   O2SAT 99.5 09/24/2019 1641     Coagulation Profile: No results for input(s): INR, PROTIME in the last 168 hours.  Cardiac Enzymes: Recent Labs  Lab 09/24/19 1550  CKTOTAL 315    HbA1C: No results found for: HGBA1C  CBG: Recent Labs  Lab 09/24/19 1533  GLUCAP 124*    Review of Systems:   Unable to obtain  Past Medical History  He,  has no past medical history on file.   Surgical History   No past surgical  history on file.   Social History      Family History   His family history is not on file.   Allergies Not on File   Home Medications  Prior to Admission medications   Not on File     I have independently seen and examined the patient, reviewed data, and developed an assessment and plan. A total of 39 minutes were spent in critical care assessment and medical decision making. This critical care time does not reflect procedure time, or teaching time or supervisory time of PA/NP/Med student/Med Resident, etc but could involve care discussion time.  Bonna Gains, MD PhD 09/25/19 10:25 AM

## 2019-09-25 NOTE — Progress Notes (Addendum)
NEURO HOSPITALIST PROGRESS NOTE   Subjective: Patient in bed, not breathing above vent, NAD. Intubated on 20 of propofol  Exam: Vitals:   09/25/19 0900 09/25/19 1000  BP: (!) 132/93 (!) 144/95  Pulse: 71 74  Resp: 14 14  Temp: 99 F (37.2 C) 99.3 F (37.4 C)  SpO2: 100% 100%    Physical Exam  Constitutional: Appears well-developed and well-nourished.  Psych: Affect appropriate to situation Eyes: Normal external eye and conjunctiva. HENT: Normocephalic, no lesions, without obvious abnormality.   Musculoskeletal-no joint tenderness, deformity or swelling Cardiovascular: Normal rate and regular rhythm.  Respiratory: Effort normal, non-labored breathing  intubated GI: Soft.  No distension. There is no tenderness.  Skin: WDI . Neurological exam Sedated and intubated  Moves BUE spontaneously and purposefully. Able to follow some simple commands including open and close eyes, raise his arms. Patient in bilateral mitten restraints. Cranial nerves: Pupils are equal round reactive to light, does not blink to threat from either side, face appears symmetric in presence of ETT.  Motor/ Sensory exam:  spontaneous movements noted in BUE.  Patient grimaces to sternal rub. Able to localize and withdraw in all 4 extremities.  Does move left extremity more than right. Coordination cannot be assessed    Medications:  Scheduled: . chlorhexidine gluconate (MEDLINE KIT)  15 mL Mouth Rinse BID  . Chlorhexidine Gluconate Cloth  6 each Topical Daily  . heparin  5,000 Units Subcutaneous Q8H  . levETIRAcetam  500 mg Per Tube BID  . mouth rinse  15 mL Mouth Rinse 10 times per day  . pantoprazole (PROTONIX) IV  40 mg Intravenous Q24H  . phenytoin (DILANTIN) IV  100 mg Intravenous Q8H  . thiamine injection  100 mg Intravenous Daily   Continuous: . lactated ringers 75 mL/hr at 09/25/19 1000  . propofol (DIPRIVAN) infusion 40 mcg/kg/min (09/25/19 1000)   QRF:XJOITGPQ  (SUBLIMAZE) injection, fentaNYL (SUBLIMAZE) injection  Pertinent Labs/Diagnostics:   Dg Abdomen 1 View  Result Date: 09/24/2019 CLINICAL DATA:  OG tube placement EXAM: ABDOMEN - 1 VIEW COMPARISON:  Concurrent chest radiograph FINDINGS: AP supine radiograph of the lower chest and upper abdomen demonstrates placement of a transesophageal tube with the tip and side port distal to the GE junction, terminating left upper quadrant. Bowel gas pattern is normal in nonspecific. Included portions of the lungs are clear aside from mild streaky atelectatic changes. Osseous structures are unremarkable. IMPRESSION: 1. Transesophageal tube tip and side port distal to the GE junction. Electronically Signed   By: Lovena Le M.D.   On: 09/24/2019 16:20   Ct Head Wo Contrast  Result Date: 09/24/2019 CLINICAL DATA:  Patient found in the middle of the road unresponsive and seizing. EXAM: CT HEAD WITHOUT CONTRAST CT CERVICAL SPINE WITHOUT CONTRAST TECHNIQUE: Multidetector CT imaging of the head and cervical spine was performed following the standard protocol without intravenous contrast. Multiplanar CT image reconstructions of the cervical spine were also generated. COMPARISON:  None. FINDINGS: CT HEAD FINDINGS Brain: No evidence of acute infarction, hemorrhage, hydrocephalus, extra-axial collection or mass lesion/mass effect. 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 No other small infarct noted along the posterolateral left frontal lobe with a third area noted along the anterior inferior left frontal lobe, the latter area likely due to remote trauma. White matter hypoattenuation is also present bilaterally consistent with  mild to moderate chronic microvascular ischemic change. Vascular: No hyperdense vessel or unexpected calcification. Skull: Normal. Negative for fracture or focal lesion. Sinuses/Orbits: Old medial right orbital wall fracture. Minor maxillary and  mild ethmoid sinus mucosal thickening. Other: None. CT CERVICAL SPINE FINDINGS Alignment: Normal. Skull base and vertebrae: No acute fracture. No primary bone lesion or focal pathologic process. Soft tissues and spinal canal: No prevertebral fluid or swelling. No visible canal hematoma. Disc levels: Mild loss of disc height at C4-C5, C5-C6 and C6-C7. Small anterior endplate spurs are noted from C2 through C7. No convincing disc herniation. Upper chest: No acute findings. Scarring noted at the lung apices. There is a nasogastric and endotracheal tube, partly imaged. Other: None. IMPRESSION: HEAD CT 1. No acute intracranial abnormalities. 2. Areas of encephalomalacia that are likely due to old trauma, or possibly old infarcts or a combination. Chronic microvascular ischemic change. 3. No skull fracture. CERVICAL CT 1. No fracture or acute finding. Electronically Signed   By: Lajean Manes M.D.   On: 09/24/2019 17:48   Ct Cervical Spine Wo Contrast  Result Date: 09/24/2019 CLINICAL DATA:  Patient found in the middle of the road unresponsive and seizing. EXAM: CT HEAD WITHOUT CONTRAST CT CERVICAL SPINE WITHOUT CONTRAST TECHNIQUE: Multidetector CT imaging of the head and cervical spine was performed following the standard protocol without intravenous contrast. Multiplanar CT image reconstructions of the cervical spine were also generated. COMPARISON:  None. FINDINGS: CT HEAD FINDINGS Brain: No evidence of acute infarction, hemorrhage, hydrocephalus, extra-axial collection or mass lesion/mass effect. 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 No other small infarct noted along the posterolateral left frontal lobe with a third area noted along the anterior inferior left frontal lobe, the latter area likely due to remote trauma. White matter hypoattenuation is also present bilaterally consistent with mild to moderate chronic microvascular ischemic change.  Vascular: No hyperdense vessel or unexpected calcification. Skull: Normal. Negative for fracture or focal lesion. Sinuses/Orbits: Old medial right orbital wall fracture. Minor maxillary and mild ethmoid sinus mucosal thickening. Other: None. CT CERVICAL SPINE FINDINGS Alignment: Normal. Skull base and vertebrae: No acute fracture. No primary bone lesion or focal pathologic process. Soft tissues and spinal canal: No prevertebral fluid or swelling. No visible canal hematoma. Disc levels: Mild loss of disc height at C4-C5, C5-C6 and C6-C7. Small anterior endplate spurs are noted from C2 through C7. No convincing disc herniation. Upper chest: No acute findings. Scarring noted at the lung apices. There is a nasogastric and endotracheal tube, partly imaged. Other: None. IMPRESSION: HEAD CT 1. No acute intracranial abnormalities. 2. Areas of encephalomalacia that are likely due to old trauma, or possibly old infarcts or a combination. Chronic microvascular ischemic change. 3. No skull fracture. CERVICAL CT 1. No fracture or acute finding. Electronically Signed   By: Lajean Manes M.D.   On: 09/24/2019 17:48   Dg Chest Port 1 View  Result Date: 09/25/2019 CLINICAL DATA:  Respiratory failure EXAM: PORTABLE CHEST 1 VIEW COMPARISON:  One day prior FINDINGS: Endotracheal tube terminates 4.4 cm above carina. Nasogastric tube extends beyond the inferior aspect of the film. The Chin overlies the left apex. Normal heart size. No pleural effusion or pneumothorax. Clear lungs. IMPRESSION: No acute cardiopulmonary disease. Electronically Signed   By: Abigail Miyamoto M.D.   On: 09/25/2019 09:44   Dg Chest Portable 1 View  Result Date: 09/24/2019 CLINICAL DATA:  Respiratory failure post intubation EXAM: PORTABLE CHEST 1  VIEW COMPARISON:  None FINDINGS: Endotracheal tube terminates in the mid trachea 3.1 cm from the carina. Transesophageal tube tip and side port distal to the GE junction. Streaky opacities in the lungs have an  appearance suggestive of atelectasis. No consolidative opacity. No pneumothorax. No effusion. The cardiomediastinal contours are unremarkable. No acute osseous or soft tissue abnormality. IMPRESSION: 1. Endotracheal tube terminates 3.1 cm from the carina. 2. Transesophageal tube tip and side port distal to the GE junction. 3. Streaky opacities in the lungs most suggestive of atelectasis. Electronically Signed   By: Lovena Le M.D.   On: 09/24/2019 16:17   Phenytoin level was elevated at 24.8  Assessment: Patient with unknown identity and therefore no accessible history in Epic, found at the side of the road unresponsive and seizing, requiring intubation for airway protection.  1. He received fosphenytoin and benzodiazepines at the outside hospital, transferred to [facility] for further work-up and management. 2. On examination, he appears to be weaker on the right side based on his response to noxious stimulation. DDx includes stroke and postictal state.  3. CT head although read formally as no acute changes, might show subtle left frontal hypodensity per Dr. Johny Chess review of the images. MRI brain reveals no acute finding. 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 Atrophy and chronic small vessel ischemia also noted.  4. Upon lowering sedation during initial evaluations, he did have generalized body shivering/tremulousness-unclear if this was a seizure. Best GCS 7 (comatose) 5. Initial DDx consisted of possible status epilepticus, seizures, and toxic/metabolic/infectious. Overall broad initial differential for the unresponsiveness. 6. Patient was started on phenytoin at OSH, and continued on phenytoin 100 three times daily at [facility]. Phenytoin level was 24.8 (he had already received dose at OSH). Patient was also loaded with 2 G keppra IV and then started on keppra 500 mg BID. 7. Routine EEG11/20/2020 IMPRESSION: This study is suggestive of severe diffuse  encephalopathy, non specific to etiology, could be secondary to sedated state.  No seizures or epileptiform discharges were seen throughout the recording. 8. Initially brought in comatose. Exam today improved. Now moves BUE spontaneously and purposefully. Able to follow some simple commands including open and close eyes, raise his arms. Was able to localize but did move left side more than right.  Recommendations: -Already received phenytoin at the outside hospital-start phenytoin 100 three times daily  -Load with Keppra 2 g IV now and Keppra 500 twice daily. -Continue the efforts to obtain any information on the patient or family per primary team as you are. -We will continue to follow with you.  Laurey Morale, MSN, NP-C [facility] [phone]   Electronically signed: Dr. Kerney Elbe 09/25/2019, 11:07 AM

## 2019-09-25 NOTE — Progress Notes (Addendum)
Initial Nutrition Assessment  DOCUMENTATION CODES:   Not applicable  INTERVENTION:   If tube feeds initiated, recommend:  Vital AF 1.2 @50ml /hr- Initiate at 14ml/hr and advance by 7ml/hr q 12 hours until goal rate is reached  Propofol: 17.02 ml/hr- provides 449kcal/day   Free water flushes 62ml q4 hours to maintain tube patency   Regimen provides 1889kcal/day, 90g/day protein, 1143ml/day free water   Provide liquid MVI daily via tube   Recommend monitor refeed labs given unknown history  NUTRITION DIAGNOSIS:   Inadequate oral intake related to inability to eat(pt sedated and ventilated) as evidenced by NPO status.  GOAL:   Provide needs based on ASPEN/SCCM guidelines  MONITOR:   Vent status, Labs, Weight trends, Skin, I & O's  REASON FOR ASSESSMENT:   Ventilator    ASSESSMENT:   Unidentified male found unresponsive in the road with seizure activity requiring intubation for airway protection   Pt sedated and ventilated. OGT in place. No plans for tube feeds today. Recommendations above if tube feeds initiated. Would consider post-pyloric NGT to reduce risk of aspiration in setting of seizures.   Medications reviewed and include: heparin, protonix, thiamine, LRS @75ml /hr, propofol  Labs reviewed: ammonia 42(H) Wbc- 13.6(H)  Patient is currently intubated on ventilator support MV: 7.5 L/min Temp (24hrs), Avg:99.4 F (37.4 C), Min:98.4 F (36.9 C), Max:100 F (37.8 C)  Propofol: 17.02 ml/hr- provides 449kcal/day   MAP- >7mmHg  UOP- 525ml  Unable to complete Nutrition-Focused physical exam at this time.   Diet Order:   Diet Order            Diet NPO time specified  Diet effective now             EDUCATION NEEDS:   No education needs have been identified at this time  Skin:  Skin Assessment: Reviewed RN Assessment  Last BM:  pta  Height:   Ht Readings from Last 1 Encounters:  09/24/19 5\' 10"  (1.778 m)    Weight:   Wt Readings from  Last 1 Encounters:  09/25/19 63.2 kg    Ideal Body Weight:  75.4 kg  BMI:  Body mass index is 19.99 kg/m.  Estimated Nutritional Needs:   Kcal:  1580-1896kcal/day (25-30kcal/kg)  Protein:  90-100g/day  Fluid:  1.6-1.8L/day  Koleen Distance MS, RD, LDN Pager #- [phone] Office#- [phone] After Hours Pager: [phone]

## 2019-09-26 LAB — BASIC METABOLIC PANEL
Anion gap: 11 (ref 5–15)
BUN: 8 mg/dL (ref 6–20)
CO2: 24 mmol/L (ref 22–32)
Calcium: 8.8 mg/dL — ABNORMAL LOW (ref 8.9–10.3)
Chloride: 104 mmol/L (ref 98–111)
Creatinine, Ser: 0.82 mg/dL (ref 0.61–1.24)
GFR calc Af Amer: >60 mL/min (ref >60)
GFR calc non Af Amer: 54 mL/min — ABNORMAL LOW (ref >60)
Glucose, Bld: 81 mg/dL (ref 70–99)
Potassium: 3.6 mmol/L (ref 3.5–5.1)
Sodium: 139 mmol/L (ref 135–145)

## 2019-09-26 LAB — CBC WITH DIFFERENTIAL/PLATELET
Abs Immature Granulocytes: 0.15 10*3/uL — ABNORMAL HIGH (ref 0.00–0.07)
Basophils Absolute: 0.1 10*3/uL (ref 0.0–0.1)
Basophils Relative: 1 %
Eosinophils Absolute: 0.1 10*3/uL (ref 0.0–0.5)
Eosinophils Relative: 0 %
HCT: 37.6 % — ABNORMAL LOW (ref 39.0–52.0)
Hemoglobin: 12.8 g/dL — ABNORMAL LOW (ref 13.0–17.0)
Immature Granulocytes: 1 %
Lymphocytes Relative: 8 %
Lymphs Abs: 1.6 10*3/uL (ref 0.7–4.0)
MCH: 31.9 pg (ref 26.0–34.0)
MCHC: 34 g/dL (ref 30.0–36.0)
MCV: 93.8 fL (ref 80.0–100.0)
Monocytes Absolute: 1.6 10*3/uL — ABNORMAL HIGH (ref 0.1–1.0)
Monocytes Relative: 9 %
Neutro Abs: 15.4 10*3/uL — ABNORMAL HIGH (ref 1.7–7.7)
Neutrophils Relative %: 81 %
Platelets: 238 10*3/uL (ref 150–400)
RBC: 4.01 MIL/uL — ABNORMAL LOW (ref 4.22–5.81)
RDW: 14.6 % (ref 11.5–15.5)
WBC: 18.9 10*3/uL — ABNORMAL HIGH (ref 4.0–10.5)
nRBC: 0 % (ref 0.0–0.2)

## 2019-09-26 LAB — COMPREHENSIVE METABOLIC PANEL
ALT: 20 U/L (ref 0–44)
ALT: 17 U/L (ref 0–44)
AST: 40 U/L (ref 15–41)
AST: 31 U/L (ref 15–41)
Albumin: 3.8 g/dL (ref 3.5–5.0)
Albumin: 3.5 g/dL (ref 3.5–5.0)
Alkaline Phosphatase: 87 U/L (ref 38–126)
Alkaline Phosphatase: 72 U/L (ref 38–126)
Anion gap: 12 (ref 5–15)
Anion gap: 14 (ref 5–15)
BUN: 11 mg/dL (ref 6–20)
BUN: 6 mg/dL (ref 6–20)
CO2: 23 mmol/L (ref 22–32)
CO2: 23 mmol/L (ref 22–32)
Calcium: 8.9 mg/dL (ref 8.9–10.3)
Calcium: 8.9 mg/dL (ref 8.9–10.3)
Chloride: 104 mmol/L (ref 98–111)
Chloride: 99 mmol/L (ref 98–111)
Creatinine, Ser: 0.8 mg/dL (ref 0.61–1.24)
Creatinine, Ser: 0.83 mg/dL (ref 0.61–1.24)
GFR calc Af Amer: >60 mL/min (ref >60)
GFR calc Af Amer: >60 mL/min (ref >60)
GFR calc non Af Amer: 54 mL/min — ABNORMAL LOW (ref >60)
GFR calc non Af Amer: >60 mL/min (ref >60)
Glucose, Bld: 109 mg/dL — ABNORMAL HIGH (ref 70–99)
Glucose, Bld: 84 mg/dL (ref 70–99)
Potassium: 3.9 mmol/L (ref 3.5–5.1)
Potassium: 3.1 mmol/L — ABNORMAL LOW (ref 3.5–5.1)
Sodium: 139 mmol/L (ref 135–145)
Sodium: 136 mmol/L (ref 135–145)
Total Bilirubin: 0.9 mg/dL (ref 0.3–1.2)
Total Bilirubin: 1 mg/dL (ref 0.3–1.2)
Total Protein: 7 g/dL (ref 6.5–8.1)
Total Protein: 6.6 g/dL (ref 6.5–8.1)

## 2019-09-26 LAB — TRIGLYCERIDES: Triglycerides: 59 mg/dL (ref <150)

## 2019-09-26 NOTE — Progress Notes (Signed)
Pt transferred to [address] with 2 belonging bags. Report to [employer]. Family notified.

## 2019-09-26 NOTE — Progress Notes (Signed)
NAME:  Cameron Hunt, MRN:  [id number], DOB:  [birth year], LOS: 2 ADMISSION DATE:  09/24/2019, CONSULTATION DATE:  09/24/2019 CHIEF COMPLAINT:  Seizure/AMS   Brief History   Male with unknown identity found unresponsive and overload with seizure-like activity, no obtainable medical history.  He was given Ativan, Versed, and phenytoin in the emergency department and intubated.  Transferred from [facility] to [facility] for continuous EEG.  History of present illness   [name], age and identity unknown who was found unresponsive and seizing in a road.  Per notes, a bystander noted he might be deaf.  He was brought by EMS to [facility] emergency department and given 6 mg of Ativan, 2 mg Versed, he was seen by neurology and loaded with phenytoin and propofol before cessation of seizure activity.  Initial vital signs were significant for blood pressure of 190/106 with mild tachycardia, no fever.  Lab work showed CO2 20, anion gap 19, leukocytosis 13.6.  Urine drug screen positive for benzodiazepines and cannabinoids.  EtOH level less than 10, work-up otherwise unremarkable.  Head CT was negative for acute findings, CXR with streaky opacities.  Covid-19 was negative.  He was transferred to [facility] and has had no further seizure activity.  Past Medical History  unknown  Significant Hospital Events   11/20>>intubated, transferred to [place]:  neurology  Procedures:  EEG 11/20 ETT 11/20   Significant Diagnostic Tests:  11/21 MRI brain-PENDING 11/20 CT head>>no acute abnormalities, areas of encephalomalacia possibly old trauma or old infarcts, chronic microvascular ischemic changes 11/20 CT C-spine>> no acute findings 11/20 CXR>>Streaky opacities in the lungs most suggestive of atelectasis  Micro Data:  11/20 Sars-CoV-2>>negative 11/20 MRSA screen>> 11/20 BCx2>>no growth to date  Antimicrobials:  none   Interim history/subjective:  No further apparent seizure-like activity;  extubated yesterday but poorly communicative. Able to sign with staff, up to bathroom, tolerating liquids this a.m.  Objective   Blood pressure (!) 138/96, pulse 97, temperature 100 F (37.8 C), temperature source Oral, resp. rate 13, weight 63.2 kg, SpO2 98 %.    Vent Mode: CPAP;PSV FiO2 (%):  [40 %] 40 % Set Rate:  [14 bmp] 14 bmp Vt Set:  [580 mL] 580 mL PEEP:  [5 cmH20] 5 cmH20 Pressure Support:  [5 cmH20] 5 cmH20 Plateau Pressure:  [17 cmH20] 17 cmH20   Intake/Output Summary (Last 24 hours) at 09/26/2019 6834 Last data filed at 09/26/2019 0600 Gross per 24 hour  Intake 1128.65 ml  Output 1390 ml  Net -261.35 ml   Filed Weights   09/25/19 0500  Weight: 63.2 kg    Examination: General: NAD HENT: AT/[place],  Lungs: CTA Cardiovascular: RRR, no r/m/g Abdomen: SND Extremities: no c/c/e, moves all spontaneously, signs with interpreter Neuro: CNII-XII grossly intact, no motor deficits, GU: deferred  Resolved Hospital Problem list     Assessment & Plan:  Seizure activity, possibly status epilepticus with inability to protect airway; extubated 11/21 w/o difficulty -Intubated in the ED, loaded with Dilantin -Unknown medical history, consider EtOH withdrawal (seems more likely now that we have an identity and previous medical records) P: -Keppra and or Dilantin per neurology-Seizure precautions -Repeat lab work, ammonia level -MRI brain unremarkable -Check blood cultures   Hypertension -initial BP 190/106 - looks like he may be prescribed CCB but also reports (through sign interpreter) that he takes no medicine regularly P: -goal reduction 25%, no anti-hypertensive medication indicated at the time of arrival  [place] -Likely secondary to lactic acid from  seizure activity P: -follow repeat metabolic panel   Best practice:  Diet: regular diet Pain/Anxiety/Delirium protocol (if indicated): CIWA protocol VAP protocol (if indicated):n/a DVT prophylaxis: SCDs GI  prophylaxis: PPI Glucose control: SSI Mobility: up with PT Code Status: full Family Communication: will try to contact family in record; through sign interpreter, patient has 2 daughters and a sister; doesn't have phone numbers for any Disposition: transfer to floor, hospitalist service, await disposition with neuroloyg, case management  Labs   CBC: Recent Labs  Lab 09/24/19 1550 09/24/19 2002 09/25/19 0713 09/26/19 0744  WBC 13.6* 16.2* 13.6* 18.9*  NEUTROABS 11.2*  --   --  15.4*  HGB 13.6 13.6 12.3* 12.8*  HCT 40.1 39.9 36.2* 37.6*  MCV 93.0 94.1 95.8 93.8  PLT 357 301 270 238    Basic Metabolic Panel: Recent Labs  Lab 09/24/19 1550 09/24/19 2002 09/25/19 0527 09/26/19 0744  NA 140 139 139 136  K 3.5 3.9 3.6 3.1*  CL 101 104 104 99  CO2 20* 23 24 23   GLUCOSE 119* 109* 81 84  BUN 16 11 8 6   CREATININE 0.85 0.80 0.82 0.83  CALCIUM 9.1 8.9 8.8* 8.9  MG 2.1  --   --   --    GFR: Estimated Creatinine Clearance: 88.8 mL/min (by C-G formula based on SCr of 0.83 mg/dL). Recent Labs  Lab 09/24/19 1550 09/24/19 2002 09/25/19 0713 09/26/19 0744  WBC 13.6* 16.2* 13.6* 18.9*    Liver Function Tests: Recent Labs  Lab 09/24/19 1550 09/24/19 2002 09/26/19 0744  AST 40 40 31  ALT 18 20 17   ALKPHOS 88 87 72  BILITOT 0.9 0.9 1.0  PROT 8.3* 7.0 6.6  ALBUMIN 4.5 3.8 3.5   No results for input(s): LIPASE, AMYLASE in the last 168 hours. Recent Labs  Lab 09/24/19 2027 09/25/19 1232  AMMONIA 47* 42*    ABG    Component Value Date/Time   PHART 7.40 09/24/2019 1641   PCO2ART 48 09/24/2019 1641   PO2ART 166 (H) 09/24/2019 1641   HCO3 29.7 (H) 09/24/2019 1641   O2SAT 99.5 09/24/2019 1641     Coagulation Profile: No results for input(s): INR, PROTIME in the last 168 hours.  Cardiac Enzymes: Recent Labs  Lab 09/24/19 1550  CKTOTAL 315    HbA1C: No results found for: HGBA1C  CBG: Recent Labs  Lab 09/24/19 1533  GLUCAP 124*    Review of Systems:    Unable to obtain  Past Medical History  He,  has no past medical history on file.   Surgical History   No past surgical history on file.   Social History      Family History   His family history is not on file.   Allergies Not on File   Home Medications  Prior to Admission medications   Not on File     I have independently seen and examined the patient, reviewed data, and developed an assessment and plan. A total of 39 minutes were spent in critical care assessment and medical decision making. This critical care time does not reflect procedure time, or teaching time or supervisory time of PA/NP/Med student/Med Resident, etc but could involve care discussion time.  09/26/2019, MD PhD 09/26/19 8:41 AM

## 2019-09-26 NOTE — Progress Notes (Signed)
Pt wrights that his name is Cameron Hunt, [place]in DOB 03/12/1961. Unit secretary to notify bed control. He is deaf, he signs. Interpreter used. She said he has a sister but he does not know her phone number.

## 2019-09-26 NOTE — Progress Notes (Signed)
Pt has gotten OOB without calling for help 3 times, despite instructions (written and sign language). MD notified, resume safety sitter and soft waist restraint.

## 2019-09-26 NOTE — Progress Notes (Signed)
Restraints dc'd.

## 2019-09-26 NOTE — Progress Notes (Addendum)
NEURO HOSPITALIST PROGRESS NOTE   Subjective: Patient in bed resting comfortably. Awake and alert. Used ASL interpreter [id number] Kathlene November. Patient goes back and forth between signing real words, and signing incomprehensibly. Patient may be experiencing some expressive aphasia. Sign-language expressive communication deficit has been noted by two different interpreters. Transferring out of ICU today.   Exam: Vitals:   09/26/19 0700 09/26/19 0900  BP:    Pulse: 97 93  Resp: 13 18  Temp:    SpO2: 98% 100%    Physical Exam  Constitutional: Appears well-developed and well-nourished.  Psych: Affect appropriate to situation Eyes: Normal external eye and conjunctiva. HENT: Normocephalic, no lesions, without obvious abnormality.   Musculoskeletal-no joint tenderness, deformity or swelling Cardiovascular: Normal rate and regular rhythm.  Respiratory: Effort normal, non-labored breathing on RA GI: Soft.  No distension. There is no tenderness.  Skin: WDI . Neurological exam  Ment: When signing patient goes back and forth between real words and gibberish per interpreter. Moves BUE spontaneously and purposefully. Able to follow simple commands including open and close eyes, raise his arms, but more complex comprehension may be impaired as well. Cranial nerves: visual fields full, Pupils are equal round reactive to light,  face symmetric Motor/ Sensory exam: able to move all 4 extremities anti gravity. Tone normal. Fingers not stiff or retracted. Coordination : Non-ataxic FNF, but patient having trouble following the full command.     Medications:  Scheduled: . chlorhexidine  15 mL Mouth Rinse BID  . Chlorhexidine Gluconate Cloth  6 each Topical Daily  . heparin  5,000 Units Subcutaneous Q8H  . mouth rinse  15 mL Mouth Rinse q12n4p  . pantoprazole (PROTONIX) IV  40 mg Intravenous Q24H  . phenytoin (DILANTIN) IV  100 mg Intravenous Q8H   Continuous: . lactated ringers  Stopped (09/25/19 2239)  . levETIRAcetam 500 mg (09/26/19 0928)  . propofol (DIPRIVAN) infusion Stopped (09/25/19 1520)   EAV:WUJWJXBJ (SUBLIMAZE) injection, fentaNYL (SUBLIMAZE) injection  Pertinent Labs/Diagnostics:   Dg Abdomen 1 View  Result Date: 09/24/2019 CLINICAL DATA:  OG tube placement EXAM: ABDOMEN - 1 VIEW COMPARISON:  Concurrent chest radiograph FINDINGS: AP supine radiograph of the lower chest and upper abdomen demonstrates placement of a transesophageal tube with the tip and side port distal to the GE junction, terminating left upper quadrant. Bowel gas pattern is normal in nonspecific. Included portions of the lungs are clear aside from mild streaky atelectatic changes. Osseous structures are unremarkable. IMPRESSION: 1. Transesophageal tube tip and side port distal to the GE junction. Electronically Signed   By: Kreg Shropshire M.D.   On: 09/24/2019 16:20   Ct Head Wo Contrast  Result Date: 09/24/2019 CLINICAL DATA:  Patient found in the middle of the road unresponsive and seizing. EXAM: CT HEAD WITHOUT CONTRAST CT CERVICAL SPINE WITHOUT CONTRAST TECHNIQUE: Multidetector CT imaging of the head and cervical spine was performed following the standard protocol without intravenous contrast. Multiplanar CT image reconstructions of the cervical spine were also generated. COMPARISON:  None. FINDINGS: CT HEAD FINDINGS Brain: No evidence of acute infarction, hemorrhage, hydrocephalus, extra-axial collection or mass lesion/mass effect. 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 No other small infarct noted along the posterolateral left frontal lobe with a third area noted along the anterior inferior left frontal lobe, the latter area likely due to remote trauma. White  matter hypoattenuation is also present bilaterally consistent with mild to moderate chronic microvascular ischemic change. Vascular: No hyperdense vessel or unexpected  calcification. Skull: Normal. Negative for fracture or focal lesion. Sinuses/Orbits: Old medial right orbital wall fracture. Minor maxillary and mild ethmoid sinus mucosal thickening. Other: None. CT CERVICAL SPINE FINDINGS Alignment: Normal. Skull base and vertebrae: No acute fracture. No primary bone lesion or focal pathologic process. Soft tissues and spinal canal: No prevertebral fluid or swelling. No visible canal hematoma. Disc levels: Mild loss of disc height at C4-C5, C5-C6 and C6-C7. Small anterior endplate spurs are noted from C2 through C7. No convincing disc herniation. Upper chest: No acute findings. Scarring noted at the lung apices. There is a nasogastric and endotracheal tube, partly imaged. Other: None. IMPRESSION: HEAD CT 1. No acute intracranial abnormalities. 2. Areas of encephalomalacia that are likely due to old trauma, or possibly old infarcts or a combination. Chronic microvascular ischemic change. 3. No skull fracture. CERVICAL CT 1. No fracture or acute finding. Electronically Signed   By: Lajean Manes M.D.   On: 09/24/2019 17:48   Ct Cervical Spine Wo Contrast  Result Date: 09/24/2019 CLINICAL DATA:  Patient found in the middle of the road unresponsive and seizing. EXAM: CT HEAD WITHOUT CONTRAST CT CERVICAL SPINE WITHOUT CONTRAST TECHNIQUE: Multidetector CT imaging of the head and cervical spine was performed following the standard protocol without intravenous contrast. Multiplanar CT image reconstructions of the cervical spine were also generated. COMPARISON:  None. FINDINGS: CT HEAD FINDINGS Brain: No evidence of acute infarction, hemorrhage, hydrocephalus, extra-axial collection or mass lesion/mass effect. 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 No other small infarct noted along the posterolateral left frontal lobe with a third area noted along the anterior inferior left frontal lobe, the latter area likely due to remote  trauma. White matter hypoattenuation is also present bilaterally consistent with mild to moderate chronic microvascular ischemic change. Vascular: No hyperdense vessel or unexpected calcification. Skull: Normal. Negative for fracture or focal lesion. Sinuses/Orbits: Old medial right orbital wall fracture. Minor maxillary and mild ethmoid sinus mucosal thickening. Other: None. CT CERVICAL SPINE FINDINGS Alignment: Normal. Skull base and vertebrae: No acute fracture. No primary bone lesion or focal pathologic process. Soft tissues and spinal canal: No prevertebral fluid or swelling. No visible canal hematoma. Disc levels: Mild loss of disc height at C4-C5, C5-C6 and C6-C7. Small anterior endplate spurs are noted from C2 through C7. No convincing disc herniation. Upper chest: No acute findings. Scarring noted at the lung apices. There is a nasogastric and endotracheal tube, partly imaged. Other: None. IMPRESSION: HEAD CT 1. No acute intracranial abnormalities. 2. Areas of encephalomalacia that are likely due to old trauma, or possibly old infarcts or a combination. Chronic microvascular ischemic change. 3. No skull fracture. CERVICAL CT 1. No fracture or acute finding. Electronically Signed   By: Lajean Manes M.D.   On: 09/24/2019 17:48   Mr Cameron Hunt NA Contrast  Result Date: 09/25/2019 CLINICAL DATA:  Encephalopathy.  Seizures. EXAM: MRI HEAD WITHOUT AND WITH CONTRAST TECHNIQUE: Multiplanar, multiecho pulse sequences of the brain and surrounding structures were obtained without and with intravenous contrast. CONTRAST:  52mL GADAVIST GADOBUTROL 1 MMOL/ML IV SOLN COMPARISON:  Head CT from yesterday FINDINGS: Brain: No acute infarction, hemorrhage, hydrocephalus, extra-axial collection or mass lesion. No evident seizure phenomenon. There are patchy areas of encephalomalacia involving cortex in the superficial left temporal lobe, left frontal operculum, and left parietal  lobe. These all conform to the MCA distribution  and are attributed to remote infarcts. Chronic small vessel ischemia in the deep cerebral white matter that is moderate. There is a degree of brain volume loss-patient age is not currently known. Vascular: Loss of flow void within the proximal left transverse sinus attributed to non dominance given there is normal enhancement in this region. Skull and upper cervical spine: Negative for marrow lesion. Sinuses/Orbits: Remote blowout fracture of the medial wall right orbit. IMPRESSION: 1. No acute finding. 2. 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 3. Atrophy and chronic small vessel ischemia. Electronically Signed   By: Marnee Spring M.D.   On: 09/25/2019 13:58   Dg Chest Port 1 View  Result Date: 09/25/2019 CLINICAL DATA:  Respiratory failure EXAM: PORTABLE CHEST 1 VIEW COMPARISON:  One day prior FINDINGS: Endotracheal tube terminates 4.4 cm above carina. Nasogastric tube extends beyond the inferior aspect of the film. The Chin overlies the left apex. Normal heart size. No pleural effusion or pneumothorax. Clear lungs. IMPRESSION: No acute cardiopulmonary disease. Electronically Signed   By: Jeronimo Greaves M.D.   On: 09/25/2019 09:44   Dg Chest Portable 1 View  Result Date: 09/24/2019 CLINICAL DATA:  Respiratory failure post intubation EXAM: PORTABLE CHEST 1 VIEW COMPARISON:  None FINDINGS: Endotracheal tube terminates in the mid trachea 3.1 cm from the carina. Transesophageal tube tip and side port distal to the GE junction. Streaky opacities in the lungs have an appearance suggestive of atelectasis. No consolidative opacity. No pneumothorax. No effusion. The cardiomediastinal contours are unremarkable. No acute osseous or soft tissue abnormality. IMPRESSION: 1. Endotracheal tube terminates 3.1 cm from the carina. 2. Transesophageal tube tip and side port distal to the GE junction. 3. Streaky opacities in the lungs most suggestive of atelectasis. Electronically Signed    By: Kreg Shropshire M.D.   On: 09/24/2019 16:17   Dg Abd Portable 1v  Result Date: 09/25/2019 CLINICAL DATA:  Encounter for metal screening. EXAM: PORTABLE ABDOMEN - 1 VIEW COMPARISON:  None. FINDINGS: Rectal thermistor is present. NG tube is present. Nonobstructive bowel gas pattern. No abnormal mass effect or gas collection IMPRESSION: Rectal thermistor in place.  No other internal metallic body. Electronically Signed   By: Marnee Spring M.D.   On: 09/25/2019 11:08   Phenytoin level was elevated at 24.8  Assessment: Patient found at the side of the road unresponsive and seizing. Required intubation for airway protection. Patient is deaf, requiring sign-language interpreter.  1. He is significantly improved today. He He received fosphenytoin and benzodiazepines at the outside hospital, transferred to [facility] for further work-up and management. 2. Initial DDx included stroke and postictal state. MRI brain revealed no acute finding. 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   3. Patient was started on phenytoin at OSH, and continued on phenytoin 100 three times daily at [facility]. Phenytoin level was 24.8 (he had already received dose at OSH). Patient was also loaded with 2 G keppra IV and then started on keppra 500 mg BID. 4. EEG on 11/20 was suggestive of severe diffuse encephalopathy, non specific to etiology, could be secondary to sedated state. No seizures or epileptiform discharges were seen throughout the recording. 5. Initially brought in comatose. Exam today improved. Now moves BUE spontaneously and purposefully. Able to follow some simple commands including open and close eyes, raise his arms.  Able to use sign language  to communicate, but again he may be displaying some expressive aphasia, which old left MCA strokes on MRI also provide an explanation for.   6. Primary team will need to interview patient more in depth to  determine if his seizure was in the context of EtOH withdrawal. EtOH level was < 10 on 11/20.  Recommendations: -Discontinue phenytoin.  -Continue Keppra 500 twice daily for now. -If patient reveals on re-interview tomorrow that he has no history of spontaneous seizures AND that this was an EtOH withdrawal seizure, then stop Keppra and continue CIWA protocol with PRN benzos. If he has a prior history of spontaneous seizures regardless of whether this seizure was due to EtOH withdrawal or not, then continue Keppra. If he has no history of spontaneous seizures and this was not an EtOH withdrawal seizure, then also continue him on Keppra as imaging revealed an old left MCA infarct which could serve as a seizure focus.  -Neurology to sign-off at this time. Please call with any further questions.   Valentina LucksJessica Williams, MSN, NP-C [facility] [phone]  Electronically signed: Dr. Caryl PinaEric Marya Lowden

## 2019-09-26 NOTE — Progress Notes (Signed)
Patient received to 3w-28 no complaints of pain or discomfort

## 2019-09-27 LAB — COMPREHENSIVE METABOLIC PANEL
ALT: 18 U/L (ref 0–44)
ALT: 14 U/L (ref 0–44)
AST: 40 U/L (ref 15–41)
AST: 21 U/L (ref 15–41)
Albumin: 4.5 g/dL (ref 3.5–5.0)
Albumin: 3.3 g/dL — ABNORMAL LOW (ref 3.5–5.0)
Alkaline Phosphatase: 88 U/L (ref 38–126)
Alkaline Phosphatase: 76 U/L (ref 38–126)
Anion gap: 19 — ABNORMAL HIGH (ref 5–15)
Anion gap: 9 (ref 5–15)
BUN: 16 mg/dL (ref 6–20)
BUN: 8 mg/dL (ref 6–20)
CO2: 20 mmol/L — ABNORMAL LOW (ref 22–32)
CO2: 27 mmol/L (ref 22–32)
Calcium: 9.1 mg/dL (ref 8.9–10.3)
Calcium: 9.1 mg/dL (ref 8.9–10.3)
Chloride: 101 mmol/L (ref 98–111)
Chloride: 103 mmol/L (ref 98–111)
Creatinine, Ser: 0.85 mg/dL (ref 0.61–1.24)
Creatinine, Ser: 0.85 mg/dL (ref 0.61–1.24)
GFR calc Af Amer: >60 mL/min (ref >60)
GFR calc Af Amer: >60 mL/min (ref >60)
GFR calc non Af Amer: 53 mL/min — ABNORMAL LOW (ref >60)
GFR calc non Af Amer: >60 mL/min (ref >60)
Glucose, Bld: 119 mg/dL — ABNORMAL HIGH (ref 70–99)
Glucose, Bld: 106 mg/dL — ABNORMAL HIGH (ref 70–99)
Potassium: 3.5 mmol/L (ref 3.5–5.1)
Potassium: 2.9 mmol/L — ABNORMAL LOW (ref 3.5–5.1)
Sodium: 140 mmol/L (ref 135–145)
Sodium: 139 mmol/L (ref 135–145)
Total Bilirubin: 0.9 mg/dL (ref 0.3–1.2)
Total Bilirubin: 0.3 mg/dL (ref 0.3–1.2)
Total Protein: 8.3 g/dL — ABNORMAL HIGH (ref 6.5–8.1)
Total Protein: 7 g/dL (ref 6.5–8.1)

## 2019-09-27 LAB — TRIGLYCERIDES: Triglycerides: 92 mg/dL (ref <150)

## 2019-09-27 MED ORDER — ENSURE ENLIVE PO LIQD
237.0000 mL | Freq: Every day | ORAL | Status: DC
  Administered 2019-09-27 – 2019-09-30 (×4): 237 mL via ORAL

## 2019-09-27 NOTE — Progress Notes (Signed)
Nutrition Follow-up  DOCUMENTATION CODES:   Not applicable  INTERVENTION:  Provide Ensure Enlive po once daily, each supplement provides 350 kcal and 20 grams of protein.  Encourage adequate PO intake.   NUTRITION DIAGNOSIS:   Inadequate oral intake related to inability to eat(pt sedated and ventilated) as evidenced by NPO status; diet advanced; improving  GOAL:   Patient will meet greater than or equal to 90% of their needs; met  MONITOR:   PO intake, Supplement acceptance, Skin, Weight trends, Labs, I & O's  REASON FOR ASSESSMENT:   Ventilator    ASSESSMENT:   58 year old male found unresponsive in the road with seizure activity requiring intubation for airway protectionExtubated 11/21. Pt is deaf and communicates via sign language. Per MD, pt may be experiencing expressive sign language communication deficit and aphasia.   Pt is currently on a regular diet with thin liquids. Meal completion has been 100%. RD to order nutritional supplements to aid in caloric and protein needs. Labs and medications reviewed.   Diet Order:   Diet Order            Diet regular Room service appropriate? No; Fluid consistency: Thin  Diet effective now              EDUCATION NEEDS:   No education needs have been identified at this time  Skin:  Skin Assessment: Reviewed RN Assessment  Last BM:  Unknown  Height:   Ht Readings from Last 1 Encounters:  09/24/19 _0  (1.778 m)    Weight:   Wt Readings from Last 1 Encounters:  09/25/19 63.2 kg    Ideal Body Weight:  75.4 kg  BMI:  Body mass index is 19.99 kg/m.  Estimated Nutritional Needs:   Kcal:  1900-2100  Protein:  90-100 grams  Fluid:  >/= 1.9 L/day    Corrin Parker, MS, RD, LDN Pager # [phone] After hours/ weekend pager # [phone]

## 2019-09-27 NOTE — Plan of Care (Signed)
  Problem: Activity: Goal: Risk for activity intolerance will decrease Outcome: Progressing   Problem: Nutrition: Goal: Adequate nutrition will be maintained Outcome: Progressing   

## 2019-09-27 NOTE — Progress Notes (Signed)
NAME:  Cameron Hunt, MRN:  [id number], DOB:  Dec 16, 1960, LOS: 3 ADMISSION DATE:  09/24/2019, CONSULTATION DATE:  09/24/2019 CHIEF COMPLAINT:  Seizure/AMS   Brief History   Male with unknown identity found unresponsive and overload with seizure-like activity, no obtainable medical history.  He was given Ativan, Versed, and phenytoin in the emergency department and intubated.  Transferred from [facility] to [facility] for continuous EEG.  History of present illness   [name], age and identity unknown who was found unresponsive and seizing in a road.  Per notes, a bystander noted he might be deaf.  He was brought by EMS to [facility] emergency department and given 6 mg of Ativan, 2 mg Versed, he was seen by neurology and loaded with phenytoin and propofol before cessation of seizure activity.  Initial vital signs were significant for blood pressure of 190/106 with mild tachycardia, no fever.  Lab work showed CO2 20, anion gap 19, leukocytosis 13.6.  Urine drug screen positive for benzodiazepines and cannabinoids.  EtOH level less than 10, work-up otherwise unremarkable.  Head CT was negative for acute findings, CXR with streaky opacities.  Covid-19 was negative.  He was transferred to [facility] and has had no further seizure activity.  Past Medical History  unknown  Significant Hospital Events   11/20>>intubated, transferred to [facility]  Consults:  neurology  Procedures:  EEG 11/20 ETT 11/20   Significant Diagnostic Tests:  11/21 MRI brain-PENDING 11/20 CT head>>no acute abnormalities, areas of encephalomalacia possibly old trauma or old infarcts, chronic microvascular ischemic changes 11/20 CT C-spine>> no acute findings 11/20 CXR>>Streaky opacities in the lungs most suggestive of atelectasis  Micro Data:  11/20 Sars-CoV-2>>negative 11/20 MRSA screen>> 11/20 BCx2>>no growth to date  Antimicrobials:  none   Interim history/subjective:  Transferred to floor. No further seizures.  Periods of agitation. Not oriented to place when interviewed via ASL interpreter.  Objective   Blood pressure (!) 136/99, pulse 77, temperature 98.4 F (36.9 C), temperature source Oral, resp. rate 16, weight 63.2 kg, SpO2 98 %.        Intake/Output Summary (Last 24 hours) at 09/27/2019 1710 Last data filed at 09/27/2019 1245 Gross per 24 hour  Intake 840 ml  Output -  Net 840 ml   Filed Weights   09/25/19 0500  Weight: 63.2 kg    Examination: General: NAD HENT: AT/[place],  Lungs: CTA Cardiovascular: RRR, no r/m/g Abdomen: SND Extremities: no c/c/e, moves all spontaneously, signs with interpreter Neuro: CNII-XII grossly intact, no motor deficits, GU: deferred  Resolved Hospital Problem list     Assessment & Plan:  Seizure activity, possibly status epilepticus with inability to protect airway; extubated 11/21 w/o difficulty -Still unclear if  EtOH withdrawal (seems more likely now that we have an identity and previous medical records) P: -Keppra and or Dilantin per neurology-Seizure precautions -Repeat lab work, ammonia level -MRI brain unremarkable -Check blood cultures -Monitor for ethoh withdrawal   Hypertension -initial BP 190/106 - looks like he may be prescribed CCB but also reports (through sign interpreter) that he takes no medicine regularly P: -goal reduction 25%, no anti-hypertensive medication indicated at the time of arrival    Best practice:  Diet: regular diet Pain/Anxiety/Delirium protocol (if indicated): CIWA protocol VAP protocol (if indicated):n/a DVT prophylaxis: SCDs GI prophylaxis: PPI Glucose control: SSI Mobility: up with PT Code Status: full Family Communication: will try to contact family in record; through sign interpreter, patient has 2 daughters and a sister; doesn't have phone numbers  for any Disposition: transfer to floor, hospitalist service, await disposition with neuroloyg, case management  Labs   CBC: Recent Labs  Lab  09/24/19 1550 09/24/19 2002 09/25/19 0713 09/26/19 0744  WBC 13.6* 16.2* 13.6* 18.9*  NEUTROABS 11.2*  --   --  15.4*  HGB 13.6 13.6 12.3* 12.8*  HCT 40.1 39.9 36.2* 37.6*  MCV 93.0 94.1 95.8 93.8  PLT 357 301 270 638    Basic Metabolic Panel: Recent Labs  Lab 09/24/19 1550 09/24/19 2002 09/25/19 0527 09/26/19 0744 09/27/19 0400  NA 140 139 139 136 139  K 3.5 3.9 3.6 3.1* 2.9*  CL 101 104 104 99 103  CO2 20* 23 24 23 27   GLUCOSE 119* 109* 81 84 106*  BUN 16 11 8 6 8   CREATININE 0.85 0.80 0.82 0.83 0.85  CALCIUM 9.1 8.9 8.8* 8.9 9.1  MG 2.1  --   --   --   --    GFR: Estimated Creatinine Clearance: 86.7 mL/min (by C-G formula based on SCr of 0.85 mg/dL). Recent Labs  Lab 09/24/19 1550 09/24/19 2002 09/25/19 0713 09/26/19 0744  WBC 13.6* 16.2* 13.6* 18.9*    Liver Function Tests: Recent Labs  Lab 09/24/19 1550 09/24/19 2002 09/26/19 0744 09/27/19 0400  AST 40 40 31 21  ALT 18 20 17 14   ALKPHOS 88 87 72 76  BILITOT 0.9 0.9 1.0 0.3  PROT 8.3* 7.0 6.6 7.0  ALBUMIN 4.5 3.8 3.5 3.3*   No results for input(s): LIPASE, AMYLASE in the last 168 hours. Recent Labs  Lab 09/24/19 2027 09/25/19 1232  AMMONIA 47* 42*    ABG    Component Value Date/Time   PHART 7.40 09/24/2019 1641   PCO2ART 48 09/24/2019 1641   PO2ART 166 (H) 09/24/2019 1641   HCO3 29.7 (H) 09/24/2019 1641   O2SAT 99.5 09/24/2019 1641     Coagulation Profile: No results for input(s): INR, PROTIME in the last 168 hours.  Cardiac Enzymes: Recent Labs  Lab 09/24/19 1550  CKTOTAL 315    HbA1C: No results found for: HGBA1C  CBG: Recent Labs  Lab 09/24/19 1533  GLUCAP 124*    Review of Systems:   Unable to obtain  Past Medical History  He,  has no past medical history on file.   Surgical History   No past surgical history on file.   Social History      Family History   His family history is not on file.   Allergies No Known Allergies   Home Medications  Prior  to Admission medications   Not on [place], MD [facility] ICU Physician [place]  Pager: [phone] Mobile: [phone] After hours: [phone].  09/27/2019, 5:41 PM      09/27/19 5:10 PM

## 2019-09-27 NOTE — Evaluation (Signed)
Physical Therapy Evaluation Patient Details Name: Cameron Hunt MRN: [id number] DOB: 14-Nov-1960 Today's Date: 09/27/2019   History of Present Illness  58 y.o. male admitted on 09/24/19 after being found downstatus epilepticus, intubated in the ED for airway protection 11/20-11/21/20.  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  Pt with other significant PMH of being deaf (needs sign language interpreter).    Clinical Impression  Utilized video interpreter for Cameron Hunt #[id number] throughout session.  He continues to confirm that some of what pt says does not make sense and some questions had to be asked multiple times without clear cut answers.  Cameron Hunt was able to get up and walk with me reporting L LE numbness and weakness with mild gait instability.  He would benefit from practicing with a cane to see if this AD will provide enough support for him to be supervision for gait.  He would benefit from post acute HH or OP therapy if he can qualify at d/c.  PT to follow acutely for deficits listed below.      Follow Up Recommendations Home health PT    Equipment Recommendations  Cane    Recommendations for Other Services   NA    Precautions / Restrictions Precautions Precautions: Fall Precaution Comments: left LE weakness, unsteady      Mobility  Bed Mobility Overal bed mobility: Modified Independent                Transfers Overall transfer level: Needs assistance Equipment used: 1 person hand held assist Transfers: Sit to/from Stand Sit to Stand: Min guard         General transfer comment: Min guard assist to steady at EOB for balance.   Ambulation/Gait Ambulation/Gait assistance: Min guard Gait Distance (Feet): 20 Feet(x2) Assistive device: 1 person hand held assist Gait Pattern/deviations: Step-through pattern;Staggering right;Staggering left Gait velocity: decrease   General Gait Details: Pt  with staggering gait pattern, reporting lightheadedness/dizziness, but could continue.  He is able to report feeling off balance.    Modified Rankin (Stroke Patients Only) Modified Rankin (Stroke Patients Only) Pre-Morbid Rankin Score: No symptoms Modified Rankin: Moderately severe disability     Balance Overall balance assessment: Needs assistance Sitting-balance support: Feet supported Sitting balance-Leahy Scale: Good     Standing balance support: Single extremity supported Standing balance-Leahy Scale: Poor Standing balance comment: needs external support in standing.                              Pertinent Vitals/Pain Pain Assessment: Faces Faces Pain Scale: Hurts a little bit Pain Location: headache Pain Descriptors / Indicators: Aching Pain Intervention(s): Limited activity within patient's tolerance;Monitored during session;Repositioned       Prior Function Level of Independence: Independent         Comments: no longer works     Hand Dominance   Dominant Hand: Right    Extremity/Trunk Assessment                Communication   Communication: Interpreter utilized;Other (comment)(ASL interpreter)  Cognition Arousal/Alertness: Awake/alert Behavior During Therapy: WFL for tasks assessed/performed Overall Cognitive Status: No family/caregiver present to determine baseline cognitive functioning                                 General Comments: Per ASL translators, they have to  ask his questions several times and he is having difficulty communicating his answers back to them, or says things that are not in context.               Assessment/Plan    PT Assessment Patient needs continued PT services  PT Problem List Decreased strength;Decreased activity tolerance;Decreased balance;Decreased mobility;Decreased cognition;Decreased knowledge of use of DME;Decreased safety awareness;Decreased knowledge of precautions;Impaired  sensation       PT Treatment Interventions DME instruction;Gait training;Stair training;Functional mobility training;Therapeutic activities;Therapeutic exercise;Balance training;Neuromuscular re-education;Cognitive remediation;Patient/family education    PT Goals (Current goals can be found in the Care Plan section)  Acute Rehab PT Goals Patient Stated Goal: to go home  PT Goal Formulation: With patient Time For Goal Achievement: 10/11/19 Potential to Achieve Goals: Good    Frequency Min 3X/week           AM-PAC PT "6 Clicks" Mobility  Outcome Measure Help needed turning from your back to your side while in a flat bed without using bedrails?: None Help needed moving from lying on your back to sitting on the side of a flat bed without using bedrails?: None Help needed moving to and from a bed to a chair (including a wheelchair)?: A Little Help needed standing up from a chair using your arms (e.g., wheelchair or bedside chair)?: A Little Help needed to walk in hospital room?: A Little Help needed climbing 3-5 steps with a railing? : A Little 6 Click Score: 20    End of Session   Activity Tolerance: Patient tolerated treatment well Patient left: in bed;with call bell/phone within reach;with bed alarm set;with restraints reapplied Nurse Communication: Mobility status PT Visit Diagnosis: Unsteadiness on feet (R26.81);Difficulty in walking, not elsewhere classified (R26.2);Other symptoms and signs involving the nervous system (R29.898)    Time: 1445-1528(extra time spent translating) PT Time Calculation (min) (ACUTE ONLY): 43 min   Charges:          Verdene Lennert, PT, DPT  Acute Rehabilitation [phone] pager #[phone] office  @ [facility]: [phone]   PT Evaluation $PT Eval Moderate Complexity: 1 Mod PT Treatments $Gait Training: 8-22 mins        09/27/2019, 5:04 PM

## 2019-09-28 LAB — COMPREHENSIVE METABOLIC PANEL
ALT: 12 U/L (ref 0–44)
AST: 17 U/L (ref 15–41)
Albumin: 3.1 g/dL — ABNORMAL LOW (ref 3.5–5.0)
Alkaline Phosphatase: 67 U/L (ref 38–126)
Anion gap: 10 (ref 5–15)
BUN: 6 mg/dL (ref 6–20)
CO2: 27 mmol/L (ref 22–32)
Calcium: 9.1 mg/dL (ref 8.9–10.3)
Chloride: 103 mmol/L (ref 98–111)
Creatinine, Ser: 0.7 mg/dL (ref 0.61–1.24)
GFR calc Af Amer: >60 mL/min (ref >60)
GFR calc non Af Amer: >60 mL/min (ref >60)
Glucose, Bld: 107 mg/dL — ABNORMAL HIGH (ref 70–99)
Potassium: 3.2 mmol/L — ABNORMAL LOW (ref 3.5–5.1)
Sodium: 140 mmol/L (ref 135–145)
Total Bilirubin: 0.6 mg/dL (ref 0.3–1.2)
Total Protein: 6.5 g/dL (ref 6.5–8.1)

## 2019-09-28 LAB — GLUCOSE, CAPILLARY: Glucose-Capillary: 112 mg/dL — ABNORMAL HIGH (ref 70–99)

## 2019-09-28 LAB — TRIGLYCERIDES: Triglycerides: 120 mg/dL (ref <150)

## 2019-09-28 MED ORDER — LEVETIRACETAM 500 MG PO TABS
500.0000 mg | ORAL_TABLET | Freq: Two times a day (BID) | ORAL | Status: DC
  Administered 2019-09-28 – 2019-09-30 (×4): 500 mg via ORAL
  Filled 2019-09-28 (×4): qty 1

## 2019-09-28 MED ORDER — PANTOPRAZOLE SODIUM 40 MG PO TBEC
40.0000 mg | DELAYED_RELEASE_TABLET | Freq: Every day | ORAL | Status: DC
  Administered 2019-09-28 – 2019-09-29 (×2): 40 mg via ORAL
  Filled 2019-09-28 (×2): qty 1

## 2019-09-28 NOTE — TOC Transition Note (Signed)
Transition of Care [facility]) - CM/SW Discharge Note   Patient Details  Name: Cameron Hunt MRN: [id number] Date of Birth: Feb 14, 1961  [facility] CM/SW Contact:  Pollie Friar, RN Phone Number: 09/28/2019, 3:27 PM   Clinical Narrative:    CM met with the patient and wrote some questions he was able to answer. CM inquired about his family and he was able to write out his daughters names. CM asked if ok to call them and he was in agreement.  CM spoke to [place] and she lives out of the states.  CM called [place] and she states she lives near him and is able to check in on him. She does work during the week until 3 pm. She is interested in getting him into alcohol rehab. CM provided her the information to call and see if they would be able to accept him. CM informed her the patient would also have to agree.  CM asked about her coming to hospital to see if patient is at his baseline. She is going to try.  CM will get recommended cane and see if able to set him up with [facility] services if doesn't d/c to rehab.     Barriers to Discharge: Continued Medical Work up   Patient Goals and CMS Choice        Discharge Placement                       Discharge Plan and Services   Discharge Planning Services: CM Consult Post Acute Care Choice: Home Health          DME Arranged: Kasandra Knudsen DME Agency: [employer] Date DME Agency Contacted: 09/28/19   Representative spoke with at DME Agency: Zack            Social Determinants of Health ([place]) Interventions     Readmission Risk Interventions No flowsheet data found.

## 2019-09-28 NOTE — Progress Notes (Signed)
[place] Progress Note Patient Name: Cameron Hunt DOB: 1961-02-06 MRN: [id number]   Date of Service  09/28/2019  HPI/Events of Note  RN requesting Telemetry order and soft wrist restraints for safety.  eICU Interventions  Telemetry and restraint orders placed.        Kerry Kass Josselyne Onofrio 09/28/2019, 9:59 PM

## 2019-09-28 NOTE — Progress Notes (Signed)
Physical Therapy Treatment Patient Details Name: Cameron Hunt MRN: [id number] DOB: 1961-03-16 Today's Date: 09/28/2019    History of Present Illness 58 y.o. male admitted on 09/24/19 after being found downstatus epilepticus, intubated in the ED for airway protection 11/20-11/21/20.  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  Pt with other significant PMH of being deaf (needs sign language interpreter).      PT Comments    Patient received in bed, sign language interpreter used throughout session. Patient demonstrates independence with bed mobility, transfers with supervision and is able to ambulate 175 feet with SPC and min guard for safety. No overt LOB, slight drifting right/left. Patient will continue to benefit from skilled PT to improve strength and independence for return home at discharge. Patient continues to have slight cognitive issues, difficulty answering questions appropriately and in context with interpreter assistance.       Follow Up Recommendations  Home health PT;Supervision - Intermittent     Equipment Recommendations  None recommended by PT;Other (comment)(reports he has a cane)    Recommendations for Other Services       Precautions / Restrictions Precautions Precautions: Fall Precaution Comments: left LE weakness, unsteady Restrictions Weight Bearing Restrictions: No    Mobility  Bed Mobility Overal bed mobility: Modified Independent             General bed mobility comments: independent  Transfers Overall transfer level: Needs assistance Equipment used: Straight cane Transfers: Sit to/from Stand Sit to Stand: Supervision;Min guard            Ambulation/Gait Ambulation/Gait assistance: Min guard Gait Distance (Feet): 175 Feet Assistive device: Straight cane Gait Pattern/deviations: Step-through pattern;Drifts right/left Gait velocity: decr   General Gait Details: no  repors of dizziness this session. Reports improved balance with [facility]   Stairs             Wheelchair Mobility    Modified Rankin (Stroke Patients Only) Modified Rankin (Stroke Patients Only) Pre-Morbid Rankin Score: No symptoms Modified Rankin: Moderately severe disability     Balance Overall balance assessment: Needs assistance Sitting-balance support: Feet supported Sitting balance-Leahy Scale: Good     Standing balance support: Single extremity supported;During functional activity Standing balance-Leahy Scale: Fair Standing balance comment: needs external support in standing.                             Cognition Arousal/Alertness: Awake/alert Behavior During Therapy: WFL for tasks assessed/performed Overall Cognitive Status: No family/caregiver present to determine baseline cognitive functioning                                 General Comments: Per ASL translators, they have to ask his questions several times and he is having difficulty communicating his answers back to them, or says things that are not in context.        Exercises      General Comments        Pertinent Vitals/Pain Pain Assessment: 0-10 Pain Score: 2  Pain Location: L LE Pain Descriptors / Indicators: Sore Pain Intervention(s): Monitored during session    Home Living                      Prior Function            PT Goals (current goals can now  be found in the care plan section) Acute Rehab PT Goals Patient Stated Goal: to go home  PT Goal Formulation: With patient Time For Goal Achievement: 10/11/19 Potential to Achieve Goals: Good Progress towards PT goals: Progressing toward goals    Frequency    Min 3X/week      PT Plan Current plan remains appropriate    Co-evaluation              AM-PAC PT "6 Clicks" Mobility   Outcome Measure  Help needed turning from your back to your side while in a flat bed without using bedrails?:  None Help needed moving from lying on your back to sitting on the side of a flat bed without using bedrails?: None Help needed moving to and from a bed to a chair (including a wheelchair)?: A Little Help needed standing up from a chair using your arms (e.g., wheelchair or bedside chair)?: A Little Help needed to walk in hospital room?: A Little Help needed climbing 3-5 steps with a railing? : A Little 6 Click Score: 20    End of Session Equipment Utilized During Treatment: Gait belt Activity Tolerance: Patient tolerated treatment well Patient left: in bed;with bed alarm set;with restraints reapplied;with call bell/phone within reach Nurse Communication: Mobility status PT Visit Diagnosis: Unsteadiness on feet (R26.81);Difficulty in walking, not elsewhere classified (R26.2);Pain;Other symptoms and signs involving the nervous system (R29.898) Pain - Right/Left: Left Pain - part of body: Leg     Time: 1315-1350 PT Time Calculation (min) (ACUTE ONLY): 35 min  Charges:  $Gait Training: 8-22 mins                     Makira Holleman, PT, GCS 09/28/19,2:13 PM

## 2019-09-28 NOTE — Progress Notes (Signed)
Occupational Therapy Evaluation Patient Details Name: Cameron Hunt MRN: [id number] DOB: 09-Apr-1961 Today's Date: 09/28/2019    History of Present Illness 58 y.o. male admitted on 09/24/19 after being found downstatus epilepticus, intubated in the ED for airway protection 11/20-11/21/20.  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  Pt with other significant PMH of being deaf (needs sign language interpreter).     Clinical Impression   PTA, pt was living at home with a roommate, and reports he was independent with ADL/IADL and functional mobility with use of spc and use the bus service for community mobility. Pt currently requires minguard for ADL completion and minguard for functional mobility at spc level. ASL interpreter reports pt with tangential and "mumbling" thoughts during session. Pt limited by cognitive and physical limitations (see OT problem list below) in safety with ADL/IADL and functional mobility completion. Due to decline in current level of function, pt would benefit from acute OT to address established goals to facilitate safe D/C to venue listed below. At this time, recommend HHOT follow-up. Will continue to follow acutely.     Follow Up Recommendations  Home health OT;Supervision - Intermittent    Equipment Recommendations  None recommended by OT    Recommendations for Other Services       Precautions / Restrictions Precautions Precautions: Fall Precaution Comments: left LE weakness, unsteady Restrictions Weight Bearing Restrictions: No      Mobility Bed Mobility Overal bed mobility: Modified Independent             General bed mobility comments: independent  Transfers Overall transfer level: Needs assistance Equipment used: Straight cane Transfers: Sit to/from Stand Sit to Stand: Supervision;Min guard         General transfer comment: Min guard assist to steady at EOB for balance.      Balance Overall balance assessment: Needs assistance Sitting-balance support: Feet supported Sitting balance-Leahy Scale: Good     Standing balance support: Single extremity supported;During functional activity Standing balance-Leahy Scale: Fair Standing balance comment: needs external support in standing.                            ADL either performed or assessed with clinical judgement   ADL Overall ADL's : Needs assistance/impaired Eating/Feeding: Set up;Sitting   Grooming: Standing;Min guard Grooming Details (indicate cue type and reason): minguard for stability with more dynamic level balance Upper Body Bathing: Min guard;Sitting   Lower Body Bathing: Min guard;Sit to/from stand   Upper Body Dressing : Min guard;Sitting   Lower Body Dressing: Min guard;Sit to/from stand Lower Body Dressing Details (indicate cue type and reason): pt donned, doffed socks while sitting EOB Toilet Transfer: Ambulation;Min guard(cane) Toilet Transfer Details (indicate cue type and reason): simulated with ambulation into the hallway Toileting- Clothing Manipulation and Hygiene: Sit to/from stand;Min guard       Functional mobility during ADLs: Min guard(cane) General ADL Comments: pt limited by instability, cognition, and safety awarenss     Vision Baseline Vision/History: Wears glasses Patient Visual Report: (pt reports change, unclear what the change is) Vision Assessment?: Vision impaired- to be further tested in functional context Additional Comments: pt reports his glasses broke, able to read, appeared to squint at times will further assess     Perception     Praxis      Pertinent Vitals/Pain Pain Assessment: Faces Pain Score: 2  Faces Pain Scale: Hurts a  little bit Pain Location: L LE Pain Descriptors / Indicators: Sore Pain Intervention(s): Limited activity within patient's tolerance;Monitored during session     Hand Dominance Right   Extremity/Trunk  Assessment Upper Extremity Assessment Upper Extremity Assessment: Overall WFL for tasks assessed   Lower Extremity Assessment Lower Extremity Assessment: Defer to PT evaluation   Cervical / Trunk Assessment Cervical / Trunk Assessment: Normal   Communication Communication Communication: Interpreter utilized;Other (comment)(ASL interpreter)   Cognition Arousal/Alertness: Awake/alert Behavior During Therapy: WFL for tasks assessed/performed Overall Cognitive Status: No family/caregiver present to determine baseline cognitive functioning                                 General Comments: Per ASL translators, they have to ask his questions several times and he is having difficulty communicating his answers back to them, or says things that are not in context. ASL reports pt "mumbling" at times, appeared tangential with thought process, when asked if he's had any changes in vision ASL report pt began to discuss his breakup with a significant other and report that his sister lives [place];pt drew a clock and when prompted to set the time to 11:10 (per ASL, referring to time as '10 past 4911' is not part of the culture and would be an inaccurate representation of cognitive functioning), pt wrote two arrows pointing at eachother one comeing from the center and the other from the number, the time was set to 11:50.     General Comments  teleinterpreter used Laurena BeringJamee [phone] and Waldon Merlkarissa [id number] and Riki RuskJeremy [id number];use of CDI is preferred     Exercises     Shoulder Instructions      Home Living Family/patient expects to be discharged to:: Private residence Living Arrangements: Non-relatives/Friends("roomate") Available Help at Discharge: Friend(s);Available PRN/intermittently Type of Home: House Home Access: Stairs to enter     Home Layout: One level               Home Equipment: None          Prior Functioning/Environment Level of Independence: Independent         Comments: no longer works        OT Problem List: Decreased activity tolerance;Impaired balance (sitting and/or standing);Decreased cognition;Decreased safety awareness      OT Treatment/Interventions: Self-care/ADL training;Therapeutic exercise;Energy conservation;DME and/or AE instruction;Therapeutic activities;Cognitive remediation/compensation;Patient/family education;Balance training    OT Goals(Current goals can be found in the care plan section) Acute Rehab OT Goals Patient Stated Goal: to go home  OT Goal Formulation: With patient Time For Goal Achievement: 10/12/19 Potential to Achieve Goals: Good ADL Goals Pt Will Perform Grooming: with modified independence;standing;sitting Pt Will Perform Upper Body Dressing: with modified independence Pt Will Perform Lower Body Dressing: with modified independence;sit to/from stand Pt Will Transfer to Toilet: with modified independence;ambulating Additional ADL Goal #1: Pt will complete ADL/IADL and communication without cues for redirection.  OT Frequency: Min 2X/week   Barriers to D/C: Decreased caregiver support          Co-evaluation              AM-PAC OT "6 Clicks" Daily Activity     Outcome Measure Help from another person eating meals?: None Help from another person taking care of personal grooming?: A Little Help from another person toileting, which includes using toliet, bedpan, or urinal?: A Little Help from another person bathing (including washing, rinsing, drying)?: A Little Help from  another person to put on and taking off regular upper body clothing?: A Little Help from another person to put on and taking off regular lower body clothing?: A Little 6 Click Score: 19   End of Session Equipment Utilized During Treatment: Gait belt(spc) Nurse Communication: Mobility status  Activity Tolerance: Patient tolerated treatment well Patient left: in bed;with call bell/phone within reach;with bed alarm set;with  restraints reapplied  OT Visit Diagnosis: Unsteadiness on feet (R26.81);Other abnormalities of gait and mobility (R26.89);Muscle weakness (generalized) (M62.81);Other symptoms and signs involving cognitive function                Time: 1791-5056 OT Time Calculation (min): 35 min Charges:  OT General Charges $OT Visit: 1 Visit OT Evaluation $OT Eval Moderate Complexity: [place] OTR/L Acute Rehabilitation Services Office: [place] 09/28/2019, 2:34 PM

## 2019-09-29 ENCOUNTER — Encounter (HOSPITAL_COMMUNITY): Payer: Self-pay | Admitting: Internal Medicine

## 2019-09-29 DIAGNOSIS — R531 Weakness: Secondary | ICD-10-CM

## 2019-09-29 LAB — CULTURE, BLOOD (ROUTINE X 2)
Culture: NO GROWTH
Culture: NO GROWTH

## 2019-09-29 MED ORDER — ACETAMINOPHEN 325 MG PO TABS
650.0000 mg | ORAL_TABLET | Freq: Four times a day (QID) | ORAL | Status: DC | PRN
  Administered 2019-09-29: 650 mg via ORAL
  Filled 2019-09-29: qty 2

## 2019-09-29 NOTE — Progress Notes (Addendum)
RN used interpreter to assess patient at this time. HE is alert x 4. He states he fell a few days ago but do not remember the exact day. He stated that he has a constant headache about 5/10 around the frontal area. He also c/o of left leg numbness and pain with ambulation. He verbalizes that he like to discuss with the doctor in the AM regarding his left foot issue.  He states he drink about 1-2 beers a day, sometimes more, He states he also smoke about 5- 6 cigarettes daily. He denies any urges to smoke or drink at this time. On call attending paged for tylenol at this time.   Pt noted ambulated to bathroom independently with RN at his side. He did not use cane at this time.  TELE sitter still at bedside.  POC Frequent rounding for safety. Bed alarm activated. Bed on low position. Possession and call light at bedside. Pt verbalized understanding to call for RN for assistance. Continual reminder d/t hx  Ave Filter, RN

## 2019-09-29 NOTE — Progress Notes (Signed)
Physical Therapy Treatment Patient Details Name: Bernell Sigal MRN: [id number] DOB: 12/02/1960 Today's Date: 09/29/2019    History of Present Illness 58 y.o. male admitted on 09/24/19 after being found downstatus epilepticus, intubated in the ED for airway protection 11/20-11/21/20.  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  Pt with other significant PMH of being deaf (needs sign language interpreter).      PT Comments    Patient progressing with balance, gait and somewhat with safety awareness.  Still having some increased lateral sway and stuttering steps, but no LOB.  Feel from mobility standpoint safe for home with intermittent family support.  A cane has been delivered to his room.  Will continue skilled PT while in the acute setting and recommend HHPT at d/c.   Follow Up Recommendations  Home health PT;Supervision - Intermittent     Equipment Recommendations  None recommended by PT    Recommendations for Other Services       Precautions / Restrictions Precautions Precautions: Fall Precaution Comments: unsteadiness    Mobility  Bed Mobility Overal bed mobility: Modified Independent                Transfers Overall transfer level: Needs assistance Equipment used: None   Sit to Stand: Supervision            Ambulation/Gait   Gait Distance (Feet): 250 Feet Assistive device: Straight cane Gait Pattern/deviations: Step-through pattern;Drifts right/left;Wide base of support     General Gait Details: demonstrates short stride length with wider BOS almost stuttering steps, but no LOB ambulating at times with S with cane in hallway   Stairs Stairs: Yes Stairs assistance: Supervision Stair Management: One rail Left;With cane;Alternating pattern;Forwards Number of Stairs: 8 General stair comments: cues for sequence to use rail on L since has rail on L at home   Wheelchair Mobility     Modified Rankin (Stroke Patients Only)       Balance Overall balance assessment: Needs assistance   Sitting balance-Leahy Scale: Good       Standing balance-Leahy Scale: Good               High level balance activites: Side stepping High Level Balance Comments: feet together eyes closed x 20 sec with S, step taps to 6" step alternating feet with cane for support            Cognition Arousal/Alertness: Awake/alert Behavior During Therapy: WFL for tasks assessed/performed Overall Cognitive Status: No family/caregiver present to determine baseline cognitive functioning                                 General Comments: perseverating on how he will get to his sister's house with no money for bus or train to get to "[place]" from here then describing multiple towns east of here ([place], [place], [place], [place], [place]) that are on the way to his sister's home.      Exercises Other Exercises Other Exercises: sit<>stand x 5 reps    General Comments General comments (skin integrity, edema, etc.): [employer] video interpreter used      Pertinent Vitals/Pain Pain Assessment: No/denies pain    Home Living                      Prior Function            PT Goals (current goals  can now be found in the care plan section) Acute Rehab PT Goals Patient Stated Goal: to go home  Progress towards PT goals: Progressing toward goals    Frequency    Min 3X/week      PT Plan Current plan remains appropriate    Co-evaluation              AM-PAC PT "6 Clicks" Mobility   Outcome Measure  Help needed turning from your back to your side while in a flat bed without using bedrails?: None Help needed moving from lying on your back to sitting on the side of a flat bed without using bedrails?: None Help needed moving to and from a bed to a chair (including a wheelchair)?: A Little Help needed standing up from a chair using your arms (e.g., wheelchair or bedside  chair)?: None Help needed to walk in hospital room?: A Little Help needed climbing 3-5 steps with a railing? : A Little 6 Click Score: 21    End of Session   Activity Tolerance: Patient tolerated treatment well Patient left: in chair;with call bell/phone within reach;with restraints reapplied;with nursing/sitter in room(telesitter)   PT Visit Diagnosis: Difficulty in walking, not elsewhere classified (R26.2);Other symptoms and signs involving the nervous system (R29.898);Other abnormalities of gait and mobility (R26.89)     Time: 1445-1511 PT Time Calculation (min) (ACUTE ONLY): 26 min  Charges:  $Gait Training: 8-22 mins $Neuromuscular Re-education: 8-22 mins                     Sheran Lawless, [place] [facility] [phone] 09/29/2019    Elray Mcgregor 09/29/2019, 5:09 PM

## 2019-09-29 NOTE — Progress Notes (Signed)
Occupational Therapy Treatment Patient Details Name: Cameron Hunt MRN: [id number] DOB: 1961/08/14 Today's Date: 09/29/2019    History of present illness 58 y.o. male admitted on 09/24/19 after being found downstatus epilepticus, intubated in the ED for airway protection 11/20-11/21/20.  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  Pt with other significant PMH of being deaf (needs sign language interpreter).     OT comments  Pt received in the recliner agreeable to OT session. Teleinterpreter used throughout [place] #[id number]. Session focused on assessing cognition through the Pillbox Test which utilizes tactile activity of transferring medication into a pillbox. Pt failed this assessment (see cognition section for additional information). Pt will continue to benefit from skilled OT services to maximize safety and independence with ADL/IADL and functional mobility. Will continue to follow acutely and progress as tolerated.    Follow Up Recommendations  Home health OT;Supervision - Intermittent    Equipment Recommendations  None recommended by OT    Recommendations for Other Services      Precautions / Restrictions Precautions Precautions: Fall Precaution Comments: left LE weakness, unsteady       Mobility Bed Mobility                  Transfers                      Balance                                           ADL either performed or assessed with clinical judgement   ADL                                               Vision       Perception     Praxis      Cognition Arousal/Alertness: Awake/alert Behavior During Therapy: WFL for tasks assessed/performed Overall Cognitive Status: No family/caregiver present to determine baseline cognitive functioning                                 General Comments: Pt completed pill box test this  session to assess cognitive processing skills and medication management. pt reports he does not take any medications at home. educated pt on the purpose and focus of this assessment. pt failed this assessment, he demonstrated difficulty with understanding the concept of "every other day", stating "i've never heard that before";pt continues to communicate with tangential thoughts per the interpreter.         Exercises     Shoulder Instructions       General Comments teleinterpreter used Brandi #[id number]    Pertinent Vitals/ Pain       Pain Assessment: No/denies pain  Home Living                                          Prior Functioning/Environment              Frequency  Min 2X/week        Progress Toward Goals  OT  Goals(current goals can now be found in the care plan section)  Progress towards OT goals: Progressing toward goals  Acute Rehab OT Goals Patient Stated Goal: to go home  OT Goal Formulation: With patient Time For Goal Achievement: 10/12/19 Potential to Achieve Goals: Good ADL Goals Pt Will Perform Grooming: with modified independence;standing;sitting Pt Will Perform Upper Body Dressing: with modified independence Pt Will Perform Lower Body Dressing: with modified independence;sit to/from stand Pt Will Transfer to Toilet: with modified independence;ambulating Additional ADL Goal #1: Pt will complete ADL/IADL and communication without cues for redirection.  Plan Discharge plan remains appropriate    Co-evaluation                 AM-PAC OT "6 Clicks" Daily Activity     Outcome Measure   Help from another person eating meals?: None Help from another person taking care of personal grooming?: A Little Help from another person toileting, which includes using toliet, bedpan, or urinal?: A Little Help from another person bathing (including washing, rinsing, drying)?: A Little Help from another person to put on and taking off regular  upper body clothing?: A Little Help from another person to put on and taking off regular lower body clothing?: A Little 6 Click Score: 19    End of Session    OT Visit Diagnosis: Unsteadiness on feet (R26.81);Other abnormalities of gait and mobility (R26.89);Muscle weakness (generalized) (M62.81);Other symptoms and signs involving cognitive function   Activity Tolerance Patient tolerated treatment well   Patient Left in bed;with call bell/phone within reach;with bed alarm set;with restraints reapplied   Nurse Communication Mobility status        Time: 4401-0272 OT Time Calculation (min): 20 min  Charges: OT General Charges $OT Visit: 1 Visit OT Treatments $Cognitive Funtion inital: Initial 15 mins $Cognitive Funtion additional: Additional15 mins  Dorinda Hill OTR/L Acute Rehabilitation Services Office: [place] 09/29/2019, 4:09 PM

## 2019-09-29 NOTE — Consult Note (Signed)
[facility] Medical Consultation  Cameron Hunt QQV:[id number] DOB: 15-Jun-1961 DOA: 09/24/2019 PCP: [facility]   Requesting physician: Dr. 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  He denies any history of seizures. EEG on 09/24/19 showed changes suggestive of severe diffuse encephalopathy without seizure or epileptiform discharges. 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 -On Keppra 500 mg twice daily, per neurology this can likely be discontinued on discharge given suspicion for alcohol withdrawal seizure no prior known history of spontaneous seizure activity  Post ICU deconditioning: -Continue PT/OT, recommending HH PT/OT  Hypertension: Not currently on antihypertensive therapy.  Start amlodipine 5 mg daily.  Hypokalemia: Replete and recheck in a.m.  Chief Complaint: Seizures  HPI:  Mr. Cameron Hunt is a 58 year old male who is deaf and communicates via sign language with medical history significant for hypertension and alcohol use who initially presented to [facility] ED unresponsive with seizure-like activity.  He was given Ativan, Versed, and phenytoin and intubated for airway protection and subsequently transferred to the [facility], ICU under the critical care service.  At the time of presentation his identity and medical history were unknown.  He was seen by neurology.  EEG on 09/24/19 showed changes suggestive of severe diffuse encephalopathy without seizure or epileptiform discharges.  Phenytoin was discontinued, he was continued on Keppra 500 mg twice daily.  He was felt to display some expressive aphasia per sign language  interpreter.  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  It was unclear whether seizure activity was due to underlying epilepsy versus alcohol withdrawal.  Patient was extubated 09/25/2019.  He was transferred to telemetry floor 09/26/2019.  Last seen by [facility] 09/27/2019.  Hospitalist service was consulted 09/29/2019 to see patient and to assume care by PCCM Dr. Lucile Shutters as he is currently on the floor without any critical care needs.  At time of my evaluation communication was facilitated with the use of sign language teleinterpreter.  Patient does not remember what happened on his presentation.  Currently he feels better but complains of some left lower leg pain and tingling.  He denies any known prior history of seizure activity.  He reports drinking a lot of beer and some liquor frequently.  He does report sometimes feeling as if he withdraws when he stops drinking alcohol.  He is not aware of any medical conditions in his immediate family.  He says he currently lives alone in [place].  Review of Systems:  All systems reviewed and are negative except as documented in history of present illness above.  Past Medical History:  Diagnosis Date  . Hypertension    No past surgical history on file. Social History:  reports current alcohol use. No history on file for tobacco and drug.  No Known Allergies Family History  Family history unknown: Yes    Prior to Admission medications   Not on File   Physical Exam: Blood pressure (!) 165/102, pulse 67, temperature 98 F (36.7 C), temperature source Oral, resp. rate 17, weight 63.2 kg, SpO2 97 %. Vitals:   09/29/19 1951 09/29/19 2258  BP: (!) 159/101 (!) 165/102  Pulse: 72 67  Resp: 18 17  Temp: 98.5 F (36.9 C) 98 F (36.7 C)  SpO2: 98% 97%    Constitutional: NAD, calm, comfortable Eyes: PERRL, lids and conjunctivae normal ENMT: Mucous membranes  are moist. Posterior pharynx clear of any exudate or lesions.Normal dentition.  Neck: normal, supple, no masses. Respiratory: clear to auscultation bilaterally, no wheezing, no crackles. Normal respiratory effort. No accessory muscle use.  Cardiovascular: Regular rate and rhythm, no murmurs / rubs / gallops. No extremity edema. 2+ pedal pulses. Abdomen: no tenderness, no masses palpated. No hepatosplenomegaly. Bowel sounds positive.  Musculoskeletal: no clubbing / cyanosis. No joint deformity upper and lower extremities. Good ROM, no contractures. Normal muscle tone.  Skin: no rashes, lesions, ulcers. No induration Neurologic: CN 2-12 grossly intact. Sensation intact, DTR normal. Strength 5/5 in all 4.  Psychiatric: Alert and oriented per sign language teleinterpreter although appears to have some tangential and perseverating communication.  Labs on Admission:  Basic Metabolic Panel: Recent Labs  Lab 09/24/19 1550 09/24/19 2002 09/25/19 0527 09/26/19 0744 09/27/19 0400 09/28/19 0404  NA 140 139 139 136 139 140  K 3.5 3.9 3.6 3.1* 2.9* 3.2*  CL 101 104 104 99 103 103  CO2 20* 23 24 23 27 27   GLUCOSE 119* 109* 81 84 106* 107*  BUN 16 11 8 6 8 6   CREATININE 0.85 0.80 0.82 0.83 0.85 0.70  CALCIUM 9.1 8.9 8.8* 8.9 9.1 9.1  MG 2.1  --   --   --   --   --    Liver Function Tests: Recent Labs  Lab 09/24/19 1550 09/24/19 2002 09/26/19 0744 09/27/19 0400 09/28/19 0404  AST 40 40 31 21 17   ALT 18 20 17 14 12   ALKPHOS 88 87 72 76 67  BILITOT 0.9 0.9 1.0 0.3 0.6  PROT 8.3* 7.0 6.6 7.0 6.5  ALBUMIN 4.5 3.8 3.5 3.3* 3.1*   No results for input(s): LIPASE, AMYLASE in the last 168 hours. Recent Labs  Lab 09/24/19 2027 09/25/19 1232  AMMONIA 47* 42*   CBC: Recent Labs  Lab 09/24/19 1550 09/24/19 2002 09/25/19 0713 09/26/19 0744  WBC 13.6* 16.2* 13.6* 18.9*  NEUTROABS 11.2*  --   --  15.4*  HGB 13.6 13.6 12.3* 12.8*  HCT 40.1 39.9 36.2* 37.6*  MCV 93.0 94.1 95.8 93.8  PLT  357 301 270 238   Cardiac Enzymes: Recent Labs  Lab 09/24/19 1550  CKTOTAL 315   BNP: Invalid input(s): POCBNP CBG: Recent Labs  Lab 09/24/19 1533 09/24/19 1927  GLUCAP 124* 112*    Radiological Exams on Admission: No results found.  EKG: 09/24/2019 EKG independently reviewed.  Sinus tachycardia, LAE, RAD.  No prior for comparison.   09/26/19, MD [facility]   If 7PM-7AM, please contact night-coverage [email or web address] 09/30/2019, 12:11 AM

## 2019-09-29 NOTE — Progress Notes (Signed)
eLink Physician-Brief Progress Note Patient Name: Cameron Hunt DOB: 1960-12-09 MRN: [id number]   Date of Service  09/29/2019  HPI/Events of Note  Patient transferred out of ICU and PCCM apparently signed off but [facility] not consulted, Pt has therefore not been seen by MD in past two days.  eICU Interventions  I consulted  [facility] and spoke to Dr. Posey Pronto. He is to see the patient in consultation tonight, and Hospitalist service is to assume his care as outlined in care plan in Dr. Phyllis Ginger note.        Kerry Kass Ogan 09/29/2019, 10:40 PM

## 2019-09-30 DIAGNOSIS — Z1389 Encounter for screening for other disorder: Secondary | ICD-10-CM

## 2019-09-30 LAB — BASIC METABOLIC PANEL
Anion gap: 10 (ref 5–15)
BUN: 6 mg/dL (ref 6–20)
CO2: 26 mmol/L (ref 22–32)
Calcium: 9.6 mg/dL (ref 8.9–10.3)
Chloride: 103 mmol/L (ref 98–111)
Creatinine, Ser: 0.63 mg/dL (ref 0.61–1.24)
GFR calc Af Amer: >60 mL/min (ref >60)
GFR calc non Af Amer: >60 mL/min (ref >60)
Glucose, Bld: 95 mg/dL (ref 70–99)
Potassium: 3.7 mmol/L (ref 3.5–5.1)
Sodium: 139 mmol/L (ref 135–145)

## 2019-09-30 MED ORDER — PANTOPRAZOLE SODIUM 40 MG PO TBEC: 40.0000 mg | DELAYED_RELEASE_TABLET | Freq: Every day | ORAL | 0 refills | Status: AC

## 2019-09-30 MED ORDER — AMLODIPINE BESYLATE 5 MG PO TABS
5.0000 mg | ORAL_TABLET | Freq: Every day | ORAL | Status: DC
  Administered 2019-09-30: 5 mg via ORAL
  Filled 2019-09-30 (×2): qty 1

## 2019-09-30 MED ORDER — POTASSIUM CHLORIDE 20 MEQ/15ML (10%) PO SOLN
40.0000 meq | Freq: Once | ORAL | Status: AC
  Administered 2019-09-30: 40 meq via ORAL
  Filled 2019-09-30: qty 30

## 2019-09-30 MED ORDER — AMLODIPINE BESYLATE 5 MG PO TABS: 5.0000 mg | ORAL_TABLET | Freq: Every day | ORAL | 0 refills | Status: AC

## 2019-09-30 NOTE — Progress Notes (Signed)
Per review of last neurology note and most recent note by Dr Posey Pronto, patient doesn't have history of seizures which makes this his first seizure in setting of suspected alcohol withdrawal, provoked seizure. Therefore, he doesn't need to continue keppra at this time.

## 2019-09-30 NOTE — Discharge Summary (Signed)
Physician Discharge Summary  Cameron Hunt ZOX:[id number] DOB: [birth year] DOA: 09/24/2019  PCP: [facility]  Admit date: 09/24/2019 Discharge date: 09/30/2019  Time spent: 35 minutes  Recommendations for Outpatient Follow-up:  1. 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  Intubated for the sameMoses: Critical care service  He was seen by neurology EEG showed severe diffuse encephalopathy placed on phenytoin and Keppra As a baseline for whether he was having seizure activity due to epilepsy versus EtOH she was kept on meds He was discontinued off of his Keppra she had no further seizures he was sent home on amlodipine he was told to follow-up in the outpatient setting to get labs  NeurologyConsultations:  Discharge Exam: Vitals:   09/29/19 2258 09/30/19 0814  BP: (!) 165/102 118/88  Pulse: 67 79  Resp: 17 15  Temp: 98 F (36.7 C) 97.9 F (36.6 C)  SpO2: 97% 100%    General: EOMI NCAT no focal deficit no blurred double vision able to move all 4 limbs equally tracks well Cardiovascular: S1-S2 no murmur rub or gallop Respiratory: Clear no added abdominal soft no rebound no guarding Power 5/5  Discharge Instructions   Discharge Instructions    Diet - low sodium heart healthy   Complete by: As directed    Discharge instructions   Complete by: As directed    Take ur blood pressure medications and follow up with ur regular Md in about 2-3 weeks Stop drinking etoh as this was the cause for ur seizure most liekly   Increase activity slowly   Complete  by: As directed      Allergies as of 09/30/2019   No Known Allergies     Medication List    TAKE these medications   amLODipine 5 MG tablet Commonly known as: NORVASC Take 1 tablet (5 mg total) by mouth daily.   pantoprazole 40 MG tablet Commonly known as: PROTONIX Take 1 tablet (40 mg total) by mouth at bedtime.            Durable Medical Equipment  (From admission, onward)         Start     Ordered   09/29/19 0835  For home use only DME Cane  Once     09/29/19 0834         No Known Allergies    The results of significant diagnostics from this hospitalization (including imaging, microbiology, ancillary and laboratory) are listed below for reference.    Significant Diagnostic Studies: Dg Abdomen 1 View  Result Date: 09/24/2019 CLINICAL DATA:  OG tube placement EXAM: ABDOMEN - 1 VIEW COMPARISON:  Concurrent chest radiograph FINDINGS: AP supine radiograph of the lower chest and upper abdomen demonstrates placement of a transesophageal tube with the tip and side port distal to the GE junction, terminating left upper quadrant. Bowel gas pattern is normal in nonspecific. Included portions of the lungs are clear aside from mild streaky atelectatic changes. Osseous structures are unremarkable. IMPRESSION: 1. Transesophageal tube tip and side port distal to the GE junction. Electronically Signed   By: Coralie Keens.D.  On: 09/24/2019 16:20   Ct Head Wo Contrast  Result Date: 09/24/2019 CLINICAL DATA:  Patient found in the middle of the road unresponsive and seizing. EXAM: CT HEAD WITHOUT CONTRAST CT CERVICAL SPINE WITHOUT CONTRAST TECHNIQUE: Multidetector CT imaging of the head and cervical spine was performed following the standard protocol without intravenous contrast. Multiplanar CT image reconstructions of the cervical spine were also generated. COMPARISON:  None. FINDINGS: CT HEAD FINDINGS Brain: No evidence of acute infarction, hemorrhage, hydrocephalus, extra-axial  collection or mass lesion/mass effect. 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 No other small infarct noted along the posterolateral left frontal lobe with a third area noted along the anterior inferior left frontal lobe, the latter area likely due to remote trauma. White matter hypoattenuation is also present bilaterally consistent with mild to moderate chronic microvascular ischemic change. Vascular: No hyperdense vessel or unexpected calcification. Skull: Normal. Negative for fracture or focal lesion. Sinuses/Orbits: Old medial right orbital wall fracture. Minor maxillary and mild ethmoid sinus mucosal thickening. Other: None. CT CERVICAL SPINE FINDINGS Alignment: Normal. Skull base and vertebrae: No acute fracture. No primary bone lesion or focal pathologic process. Soft tissues and spinal canal: No prevertebral fluid or swelling. No visible canal hematoma. Disc levels: Mild loss of disc height at C4-C5, C5-C6 and C6-C7. Small anterior endplate spurs are noted from C2 through C7. No convincing disc herniation. Upper chest: No acute findings. Scarring noted at the lung apices. There is a nasogastric and endotracheal tube, partly imaged. Other: None. IMPRESSION: HEAD CT 1. No acute intracranial abnormalities. 2. Areas of encephalomalacia that are likely due to old trauma, or possibly old infarcts or a combination. Chronic microvascular ischemic change. 3. No skull fracture. CERVICAL CT 1. No fracture or acute finding. Electronically Signed   By: Amie Portland M.D.   On: 09/24/2019 17:48   Ct Cervical Spine Wo Contrast  Result Date: 09/24/2019 CLINICAL DATA:  Patient found in the middle of the road unresponsive and seizing. EXAM: CT HEAD WITHOUT CONTRAST CT CERVICAL SPINE WITHOUT CONTRAST TECHNIQUE: Multidetector CT imaging of the head and cervical spine was performed following the standard protocol without intravenous contrast. Multiplanar CT image  reconstructions of the cervical spine were also generated. COMPARISON:  None. FINDINGS: CT HEAD FINDINGS Brain: No evidence of acute infarction, hemorrhage, hydrocephalus, extra-axial collection or mass lesion/mass effect. 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 No other small infarct noted along the posterolateral left frontal lobe with a third area noted along the anterior inferior left frontal lobe, the latter area likely due to remote trauma. White matter hypoattenuation is also present bilaterally consistent with mild to moderate chronic microvascular ischemic change. Vascular: No hyperdense vessel or unexpected calcification. Skull: Normal. Negative for fracture or focal lesion. Sinuses/Orbits: Old medial right orbital wall fracture. Minor maxillary and mild ethmoid sinus mucosal thickening. Other: None. CT CERVICAL SPINE FINDINGS Alignment: Normal. Skull base and vertebrae: No acute fracture. No primary bone lesion or focal pathologic process. Soft tissues and spinal canal: No prevertebral fluid or swelling. No visible canal hematoma. Disc levels: Mild loss of disc height at C4-C5, C5-C6 and C6-C7. Small anterior endplate spurs are noted from C2 through C7. No convincing disc herniation. Upper chest: No acute findings. Scarring noted at the lung apices. There is a nasogastric and endotracheal tube, partly imaged. Other: None. IMPRESSION: HEAD CT 1. No acute intracranial abnormalities. 2. Areas of encephalomalacia that are  likely due to old trauma, or possibly old infarcts or a combination. Chronic microvascular ischemic change. 3. No skull fracture. CERVICAL CT 1. No fracture or acute finding. Electronically Signed   By: Lajean Manes M.D.   On: 09/24/2019 17:48   Mr Jeri Cos GL Contrast  Result Date: 09/25/2019 CLINICAL DATA:  Encephalopathy.  Seizures. EXAM: MRI HEAD WITHOUT AND WITH CONTRAST TECHNIQUE: Multiplanar, multiecho pulse sequences of  the brain and surrounding structures were obtained without and with intravenous contrast. CONTRAST:  42mL GADAVIST GADOBUTROL 1 MMOL/ML IV SOLN COMPARISON:  Head CT from yesterday FINDINGS: Brain: No acute infarction, hemorrhage, hydrocephalus, extra-axial collection or mass lesion. No evident seizure phenomenon. There are patchy areas of encephalomalacia involving cortex in the superficial left temporal lobe, left frontal operculum, and left parietal lobe. These all conform to the MCA distribution and are attributed to remote infarcts. Chronic small vessel ischemia in the deep cerebral white matter that is moderate. There is a degree of brain volume loss-patient age is not currently known. Vascular: Loss of flow void within the proximal left transverse sinus attributed to non dominance given there is normal enhancement in this region. Skull and upper cervical spine: Negative for marrow lesion. Sinuses/Orbits: Remote blowout fracture of the medial wall right orbit. IMPRESSION: 1. No acute finding. 2. 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 3. Atrophy and chronic small vessel ischemia. Electronically Signed   By: Monte Fantasia M.D.   On: 09/25/2019 13:58   Dg Chest Port 1 View  Result Date: 09/25/2019 CLINICAL DATA:  Respiratory failure EXAM: PORTABLE CHEST 1 VIEW COMPARISON:  One day prior FINDINGS: Endotracheal tube terminates 4.4 cm above carina. Nasogastric tube extends beyond the inferior aspect of the film. The Chin overlies the left apex. Normal heart size. No pleural effusion or pneumothorax. Clear lungs. IMPRESSION: No acute cardiopulmonary disease. Electronically Signed   By: Abigail Miyamoto M.D.   On: 09/25/2019 09:44   Dg Chest Portable 1 View  Result Date: 09/24/2019 CLINICAL DATA:  Respiratory failure post intubation EXAM: PORTABLE CHEST 1 VIEW COMPARISON:  None FINDINGS: Endotracheal tube terminates in the mid trachea 3.1 cm from the carina. Transesophageal  tube tip and side port distal to the GE junction. Streaky opacities in the lungs have an appearance suggestive of atelectasis. No consolidative opacity. No pneumothorax. No effusion. The cardiomediastinal contours are unremarkable. No acute osseous or soft tissue abnormality. IMPRESSION: 1. Endotracheal tube terminates 3.1 cm from the carina. 2. Transesophageal tube tip and side port distal to the GE junction. 3. Streaky opacities in the lungs most suggestive of atelectasis. Electronically Signed   By: Lovena Le M.D.   On: 09/24/2019 16:17   Dg Abd Portable 1v  Result Date: 09/25/2019 CLINICAL DATA:  Encounter for metal screening. EXAM: PORTABLE ABDOMEN - 1 VIEW COMPARISON:  None. FINDINGS: Rectal thermistor is present. NG tube is present. Nonobstructive bowel gas pattern. No abnormal mass effect or gas collection IMPRESSION: Rectal thermistor in place.  No other internal metallic body. Electronically Signed   By: Monte Fantasia M.D.   On: 09/25/2019 11:08    Microbiology: Recent Results (from the past 240 hour(s))  SARS Coronavirus 2 by RT PCR (hospital order, performed in [facility] hospital lab) Nasopharyngeal Nasopharyngeal Swab     Status: None   Collection Time: 09/24/19  4:30 PM   Specimen: Nasopharyngeal Swab  Result Value Ref Range Status   SARS Coronavirus 2 NEGATIVE NEGATIVE Final    Comment: (NOTE)  If result is NEGATIVE SARS-CoV-2 target nucleic acids are NOT DETECTED. The SARS-CoV-2 RNA is generally detectable in upper and lower  respiratory specimens during the acute phase of infection. The lowest  concentration of SARS-CoV-2 viral copies this assay can detect is 250  copies / mL. A negative result does not preclude SARS-CoV-2 infection  and should not be used as the sole basis for treatment or other  patient management decisions.  A negative result may occur with  improper specimen collection / handling, submission of specimen other  than nasopharyngeal swab, presence of  viral mutation(s) within the  areas targeted by this assay, and inadequate number of viral copies  (<250 copies / mL). A negative result must be combined with clinical  observations, patient history, and epidemiological information. If result is POSITIVE SARS-CoV-2 target nucleic acids are DETECTED. The SARS-CoV-2 RNA is generally detectable in upper and lower  respiratory specimens dur ing the acute phase of infection.  Positive  results are indicative of active infection with SARS-CoV-2.  Clinical  correlation with patient history and other diagnostic information is  necessary to determine patient infection status.  Positive results do  not rule out bacterial infection or co-infection with other viruses. If result is PRESUMPTIVE POSTIVE SARS-CoV-2 nucleic acids MAY BE PRESENT.   A presumptive positive result was obtained on the submitted specimen  and confirmed on repeat testing.  While 2019 novel coronavirus  (SARS-CoV-2) nucleic acids may be present in the submitted sample  additional confirmatory testing may be necessary for epidemiological  and / or clinical management purposes  to differentiate between  SARS-CoV-2 and other Sarbecovirus currently known to infect humans.  If clinically indicated additional testing with an alternate test  methodology [phone]([id number]) is advised. The SARS-CoV-2 RNA is generally  detectable in upper and lower respiratory sp ecimens during the acute  phase of infection. The expected result is Negative. Fact Sheet for Patients:  BoilerBrush.com.cyhttps://www.fda.gov/media/136312/download Fact Sheet for Healthcare Providers: https://pope.com/https://www.fda.gov/media/136313/download This test is not yet approved or cleared by the [place] FDA and has been authorized for detection and/or diagnosis of SARS-CoV-2 by FDA under an Emergency Use Authorization (EUA).  This EUA will remain in effect (meaning this test can be used) for the duration of the COVID-19 declaration under Section  564(b)(1) of the Act, 21 U.S.C. section 360bbb-3(b)(1), unless the authorization is terminated or revoked sooner. Performed at [facility], [address] [place], [place] [address]   MRSA PCR Screening     Status: None   Collection Time: 09/24/19  7:55 PM   Specimen: Nasopharyngeal  Result Value Ref Range Status   MRSA by PCR NEGATIVE NEGATIVE Final    Comment:        The GeneXpert MRSA Assay (FDA approved for NASAL specimens only), is one component of a comprehensive MRSA colonization surveillance program. It is not intended to diagnose MRSA infection nor to guide or monitor treatment for MRSA infections. Performed at [facility], [address] [place], [place] [address]   Culture, blood (routine x 2)     Status: None   Collection Time: 09/24/19  8:51 PM   Specimen: BLOOD RIGHT HAND  Result Value Ref Range Status   Specimen Description BLOOD RIGHT HAND  Final   Special Requests   Final    AEROBIC BOTTLE ONLY Blood Culture results may not be optimal due to an inadequate volume of blood received in culture bottles   Culture   Final    NO  GROWTH 5 DAYS Performed at [facility], [address] [place], [place] [address]    Report Status 09/29/2019 FINAL  Final  Culture, blood (routine x 2)     Status: None   Collection Time: 09/24/19  9:08 PM   Specimen: BLOOD LEFT HAND  Result Value Ref Range Status   Specimen Description BLOOD LEFT HAND  Final   Special Requests   Final    AEROBIC BOTTLE ONLY Blood Culture results may not be optimal due to an inadequate volume of blood received in culture bottles   Culture   Final    NO GROWTH 5 DAYS Performed at [facility], [address] [place], [place] [address]    Report Status 09/29/2019 FINAL  Final     Labs: Basic Metabolic Panel: Recent Labs  Lab 09/24/19 1550  09/25/19 0527 09/26/19 0744 09/27/19 0400 09/28/19 0404 09/30/19 0313  NA 140   < > 139 136 139 140 139  K 3.5   < >  3.6 3.1* 2.9* 3.2* 3.7  CL 101   < > 104 99 103 103 103  CO2 20*   < > GLUCOSE 119*   < > 81 84 106* 107* 95  BUN 16   < > CREATININE 0.85   < > 0.82 0.83 0.85 0.70 0.63  CALCIUM 9.1   < > 8.8* 8.9 9.1 9.1 9.6  MG 2.1  --   --   --   --   --   --    < > = values in this interval not displayed.   Liver Function Tests: Recent Labs  Lab 09/24/19 1550 09/24/19 2002 09/26/19 0744 09/27/19 0400 09/28/19 0404  AST 40 40 ALT ALKPHOS 88 87 72 76 67  BILITOT 0.9 0.9 1.0 0.3 0.6  PROT 8.3* 7.0 6.6 7.0 6.5  ALBUMIN 4.5 3.8 3.5 3.3* 3.1*   No results for input(s): LIPASE, AMYLASE in the last 168 hours. Recent Labs  Lab 09/24/19 2027 09/25/19 1232  AMMONIA 47* 42*   CBC: Recent Labs  Lab 09/24/19 1550 09/24/19 2002 09/25/19 0713 09/26/19 0744  WBC 13.6* 16.2* 13.6* 18.9*  NEUTROABS 11.2*  --   --  15.4*  HGB 13.6 13.6 12.3* 12.8*  HCT 40.1 39.9 36.2* 37.6*  MCV 93.0 94.1 95.8 93.8  PLT 357 301 270 238   Cardiac Enzymes: Recent Labs  Lab 09/24/19 1550  CKTOTAL 315   BNP: BNP (last 3 results) No results for input(s): BNP in the last 8760 hours.  ProBNP (last 3 results) No results for input(s): PROBNP in the last 8760 hours.  CBG: Recent Labs  Lab 09/24/19 1533 09/24/19 1927  GLUCAP 124* 112*       Signed:  Rhetta Mura MD   [facility] 09/30/2019, 10:32 AM

## 2019-09-30 NOTE — TOC Transition Note (Signed)
Transition of Care [facility]) - CM/SW Discharge Note   Patient Details  Name: Cameron Hunt MRN: [id number] Date of Birth: 21-Sep-1961  [facility] CM/SW Contact:  Pollie Friar, RN Phone Number: 09/30/2019, 11:43 AM   Clinical Narrative:    Pt discharged home with self care. HH recommended but CM unable to find a [place] agency to accept the patient. MD aware.  Pt has cane at bedside.  CM provided petty cash to assist patient in affording his d/c meds. CM provided cab voucher to assist in transportation home.  CM called patients daughter and made her aware that pt discharging home today.    Final next level of care: Home/Self Care Barriers to Discharge: No Barriers Identified   Patient Goals and CMS Choice        Discharge Placement                       Discharge Plan and Services   Discharge Planning Services: CM Consult Post Acute Care Choice: Home Health          DME Arranged: Kasandra Knudsen DME Agency: [employer] Date DME Agency Contacted: 09/28/19   Representative spoke with at DME Agency: Zack            Social Determinants of Health ([place]) Interventions     Readmission Risk Interventions No flowsheet data found.

## 2019-10-01 ENCOUNTER — Encounter: Payer: Self-pay | Admitting: Emergency Medicine

## 2019-11-10 ENCOUNTER — Emergency Department
Admission: EM | Admit: 2019-11-10 | Discharge: 2019-11-10 | Disposition: A | Discharge status: Home / Self Care | Payer: Medicaid Other | Attending: Emergency Medicine | Admitting: Emergency Medicine

## 2019-11-10 ENCOUNTER — Encounter: Payer: Self-pay | Admitting: *Deleted

## 2019-11-10 ENCOUNTER — Emergency Department: Payer: Medicaid Other

## 2019-11-10 ENCOUNTER — Other Ambulatory Visit: Payer: Self-pay

## 2019-11-10 DIAGNOSIS — Y999 Unspecified external cause status: Secondary | ICD-10-CM | POA: Diagnosis not present

## 2019-11-10 DIAGNOSIS — Y92481 Parking lot as the place of occurrence of the external cause: Secondary | ICD-10-CM | POA: Diagnosis not present

## 2019-11-10 DIAGNOSIS — W010XXA Fall on same level from slipping, tripping and stumbling without subsequent striking against object, initial encounter: Secondary | ICD-10-CM | POA: Insufficient documentation

## 2019-11-10 DIAGNOSIS — S0083XA Contusion of other part of head, initial encounter: Secondary | ICD-10-CM | POA: Insufficient documentation

## 2019-11-10 DIAGNOSIS — I10 Essential (primary) hypertension: Secondary | ICD-10-CM | POA: Insufficient documentation

## 2019-11-10 DIAGNOSIS — W19XXXA Unspecified fall, initial encounter: Secondary | ICD-10-CM

## 2019-11-10 DIAGNOSIS — Y939 Activity, unspecified: Secondary | ICD-10-CM | POA: Diagnosis not present

## 2019-11-10 DIAGNOSIS — F17210 Nicotine dependence, cigarettes, uncomplicated: Secondary | ICD-10-CM | POA: Diagnosis not present

## 2019-11-10 DIAGNOSIS — S0990XA Unspecified injury of head, initial encounter: Secondary | ICD-10-CM | POA: Diagnosis present

## 2019-11-10 DIAGNOSIS — T148XXA Other injury of unspecified body region, initial encounter: Secondary | ICD-10-CM

## 2019-11-10 DIAGNOSIS — Z79899 Other long term (current) drug therapy: Secondary | ICD-10-CM | POA: Insufficient documentation

## 2019-11-10 MED ORDER — ACETAMINOPHEN 325 MG PO TABS
650.0000 mg | ORAL_TABLET | Freq: Once | ORAL | Status: AC
  Administered 2019-11-10: 05:00:00 650 mg via ORAL
  Filled 2019-11-10: qty 2

## 2019-11-10 NOTE — ED Provider Notes (Signed)
[facility] Emergency Department Provider Note   ____________________________________________   First MD Initiated Contact with Patient 11/10/19 [phone]     (approximate)  I have reviewed the triage vital signs and the nursing notes.   HISTORY  Chief Complaint Facial Injury  History obtained via ASL [employer] interpreter  HPI Cameron Hunt is a 59 y.o. male brought to the ED via EMS from the [facility] where he tripped over the curb and fell, striking his face.  Denies LOC.  Initially had a nosebleed which has currently resolved.  Presents with abrasion below the nose and left upper lip.  Denies neck pain, chest pain, shortness of breath, abdominal pain, nausea or vomiting.  Denies anticoagulant use.  Tetanus is up-to-date.       Past Medical History:  Diagnosis Date  . Deaf   . Hypertension     Patient Active Problem List   Diagnosis Date Noted  . Seizure ([place]) 09/24/2019  . Status epilepticus ([place])   . Endotracheally intubated   . 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 Alcohol abuse 05/26/2018  . Deaf 05/26/2018    No past surgical history on file.  Prior to Admission medications   Medication Sig Start Date End Date Taking? Authorizing Provider  amLODipine (NORVASC) 5 MG tablet Take 1 tablet (5 mg total) by mouth daily. 11/28/18 11/28/19  Cuthriell, Charline Bills, PA-C  amLODipine (NORVASC) 5 MG tablet Take 1 tablet (5 mg total) by mouth daily. 09/30/19   Nita Sells, MD  fluconazole (DIFLUCAN) 150 MG tablet Take 1 tablet (150 mg total) by mouth once a week. Patient not taking: Reported on 02/26/2019 11/28/18   Cuthriell, Charline Bills, PA-C  lidocaine (LIDODERM) 5 % Place 1 patch onto the skin every 12 (twelve) hours. Remove & Discard patch within 12 hours or as directed by MD.  Pershing Proud the patch off for 12 hours before applying a new one. 12/08/18 12/08/19  Hinda Kehr, MD  meloxicam (MOBIC) 15 MG tablet Take 1 tablet (15 mg total) by mouth  daily. 11/28/18   Cuthriell, Charline Bills, PA-C  pantoprazole (PROTONIX) 40 MG tablet Take 1 tablet (40 mg total) by mouth at bedtime. 09/30/19   Nita Sells, MD    Allergies Patient has no known allergies.  Family History  Family history unknown: Yes    Social History Social History   Tobacco Use  . Smoking status: Current Some Day Smoker  . Smokeless tobacco: Never Used  Substance Use Topics  . Alcohol use: Yes    Comment: A lot of beer, some liquor  . Drug use: Not Currently    Review of Systems  Constitutional: No fever/chills Eyes: No visual changes. ENT: Positive for facial abrasions.  No sore throat. Cardiovascular: Denies chest pain. Respiratory: Denies shortness of breath. Gastrointestinal: No abdominal pain.  No nausea, no vomiting.  No diarrhea.  No constipation. Genitourinary: Negative for dysuria. Musculoskeletal: Negative for back pain. Skin: Negative for rash. Neurological: Negative for headaches, focal weakness or numbness.   ____________________________________________   PHYSICAL EXAM:  VITAL SIGNS: ED Triage Vitals  Enc Vitals Group     BP 11/10/19 0203 129/81     Pulse Rate 11/10/19 0203 96     Resp 11/10/19 0203 20     Temp 11/10/19 0203 99 F (37.2 C)     Temp Source 11/10/19 0203 Oral     SpO2 11/10/19 0203 97 %     Weight 11/10/19 0205 185 lb (83.9 kg)  Height 11/10/19 0205 6' (1.829 m)     Head Circumference --      Peak Flow --      Pain Score --      Pain Loc --      Pain Edu? --      Excl. in GC? --     Constitutional: Alert and oriented. Well appearing and in no acute distress. Eyes: Conjunctivae are normal. PERRL. EOMI. Head: Atraumatic. Nose: Clotted nasal blood.  Abrasion beneath left nostril and above left upper lip. Mouth/Throat: Mucous membranes are moist.  No dental malocclusion. Neck: No stridor.  No cervical spine tenderness to palpation. Cardiovascular: Normal rate, regular rhythm. Grossly normal heart  sounds.  Good peripheral circulation. Respiratory: Normal respiratory effort.  No retractions. Lungs CTAB. Gastrointestinal: Soft and nontender. No distention. No abdominal bruits. No CVA tenderness. Musculoskeletal: No lower extremity tenderness nor edema.  No joint effusions. Neurologic:  Normal language. No gross focal neurologic deficits are appreciated. No gait instability. Skin:  Skin is warm, dry and intact. No rash noted. Psychiatric: Mood and affect are normal. Speech and behavior are normal.  ____________________________________________   LABS (all labs ordered are listed, but only abnormal results are displayed)  Labs Reviewed - No data to display ____________________________________________  EKG  None ____________________________________________  RADIOLOGY  ED MD interpretation: No ICH, no facial fracture  Official radiology report(s): CT Head Wo Contrast  Result Date: 11/10/2019 CLINICAL DATA:  Fall with facial trauma EXAM: CT HEAD WITHOUT CONTRAST CT MAXILLOFACIAL WITHOUT CONTRAST TECHNIQUE: Multidetector CT imaging of the head and maxillofacial structures were performed using the standard protocol without intravenous contrast. Multiplanar CT image reconstructions of the maxillofacial structures were also generated. COMPARISON:  Head CT 11/10/2019 FINDINGS: CT HEAD FINDINGS Brain: There is no mass, hemorrhage or extra-axial collection. The size and configuration of the ventricles and extra-axial CSF spaces are normal. Encephalomalacia of the anterior left temporal lobe is unchanged There is periventricular hypoattenuation compatible with chronic microvascular disease. Vascular: No hyperdense vessel or unexpected vascular calcification. Skull: Mild left frontal scalp swelling.  No skull fracture. CT MAXILLOFACIAL FINDINGS Osseous: --Complex facial fracture types: No LeFort, zygomaticomaxillary complex or nasoorbitoethmoidal fracture. --Simple fracture types: None. --Mandible,  hard palate and teeth: No acute abnormality. Incidentally noted torus mandibularis. Multiple periapical lucencies. Orbits: The globes are intact. Normal appearance of the intra- and extraconal fat. Symmetric extraocular muscles. There is a chronic right lamina papyracea fracture. Sinuses: No fluid levels or advanced mucosal thickening. Soft tissues: Normal visualized extracranial soft tissues. IMPRESSION: 1. Chronic ischemic microangiopathy without acute intracranial abnormality. 2. Mild left frontal scalp swelling without skull fracture. 3. No facial fracture. Electronically Signed   By: Deatra Robinson M.D.   On: 11/10/2019 03:43   CT Maxillofacial Wo Contrast  Result Date: 11/10/2019 CLINICAL DATA:  Fall with facial trauma EXAM: CT HEAD WITHOUT CONTRAST CT MAXILLOFACIAL WITHOUT CONTRAST TECHNIQUE: Multidetector CT imaging of the head and maxillofacial structures were performed using the standard protocol without intravenous contrast. Multiplanar CT image reconstructions of the maxillofacial structures were also generated. COMPARISON:  Head CT 11/10/2019 FINDINGS: CT HEAD FINDINGS Brain: There is no mass, hemorrhage or extra-axial collection. The size and configuration of the ventricles and extra-axial CSF spaces are normal. Encephalomalacia of the anterior left temporal lobe is unchanged There is periventricular hypoattenuation compatible with chronic microvascular disease. Vascular: No hyperdense vessel or unexpected vascular calcification. Skull: Mild left frontal scalp swelling.  No skull fracture. CT MAXILLOFACIAL FINDINGS Osseous: --Complex facial fracture types: No  LeFort, zygomaticomaxillary complex or nasoorbitoethmoidal fracture. --Simple fracture types: None. --Mandible, hard palate and teeth: No acute abnormality. Incidentally noted torus mandibularis. Multiple periapical lucencies. Orbits: The globes are intact. Normal appearance of the intra- and extraconal fat. Symmetric extraocular muscles. There  is a chronic right lamina papyracea fracture. Sinuses: No fluid levels or advanced mucosal thickening. Soft tissues: Normal visualized extracranial soft tissues. IMPRESSION: 1. Chronic ischemic microangiopathy without acute intracranial abnormality. 2. Mild left frontal scalp swelling without skull fracture. 3. No facial fracture. Electronically Signed   By: Deatra Robinson M.D.   On: 11/10/2019 03:43    ____________________________________________   PROCEDURES  Procedure(s) performed (including Critical Care):  Procedures   ____________________________________________   INITIAL IMPRESSION / ASSESSMENT AND PLAN / ED COURSE  As part of my medical decision making, I reviewed the following data within the electronic MEDICAL RECORD NUMBER Nursing notes reviewed and incorporated, Interpreter needed, Radiograph reviewed and Notes from prior ED visits     Cameron Hunt was evaluated in Emergency Department on 11/10/2019 for the symptoms described in the history of present illness. He was evaluated in the context of the global COVID-19 pandemic, which necessitated consideration that the patient might be at risk for infection with the SARS-CoV-2 virus that causes COVID-19. Institutional protocols and algorithms that pertain to the evaluation of patients at risk for COVID-19 are in a state of rapid change based on information released by regulatory bodies including the CDC and federal and state organizations. These policies and algorithms were followed during the patient's care in the ED.    59 year old deaf male who presents status post mechanical fall with facial abrasions.  CT head and maxillofacial negative for acute traumatic injuries.  Patient given something to eat and Tylenol.  Strict return precautions given.  Patient verbalizes understanding and agrees with plan of care.      ____________________________________________   FINAL CLINICAL IMPRESSION(S) / ED DIAGNOSES  Final diagnoses:  Fall,  initial encounter  Facial contusion, initial encounter  Abrasion     ED Discharge Orders    None       Note:  This document was prepared using Dragon voice recognition software and may include unintentional dictation errors.   Irean Hong, MD 11/10/19 [phone]

## 2019-11-10 NOTE — Discharge Instructions (Addendum)
You may take Tylenol and/or Ibuprofen as needed for pain.  Return to the ER for worsening symptoms, persistent vomiting, lethargy or other concerns. 

## 2019-11-10 NOTE — ED Triage Notes (Addendum)
Pt brought in via ems [facility], pt tripped over the curb and fell.  Pt had a nosebleed.  No bleeding now.  Pt has abrasion below the nose and upper lip.  No loc  Pt alert.  Pt is deaf.

## 2019-11-10 NOTE — ED Notes (Signed)
Pt transported to CT ?

## 2019-11-10 NOTE — ED Notes (Signed)
Video ASL interpreter used for discharge papers.

## 2019-11-18 ENCOUNTER — Other Ambulatory Visit: Payer: Self-pay

## 2019-11-18 ENCOUNTER — Encounter: Payer: Self-pay | Admitting: *Deleted

## 2019-11-18 DIAGNOSIS — I10 Essential (primary) hypertension: Secondary | ICD-10-CM | POA: Diagnosis not present

## 2019-11-18 DIAGNOSIS — M79672 Pain in left foot: Secondary | ICD-10-CM | POA: Diagnosis present

## 2019-11-18 DIAGNOSIS — F17210 Nicotine dependence, cigarettes, uncomplicated: Secondary | ICD-10-CM | POA: Diagnosis not present

## 2019-11-18 DIAGNOSIS — Z79899 Other long term (current) drug therapy: Secondary | ICD-10-CM | POA: Insufficient documentation

## 2019-11-18 LAB — COMPREHENSIVE METABOLIC PANEL
ALT: 32 U/L (ref 0–44)
AST: 51 U/L — ABNORMAL HIGH (ref 15–41)
Albumin: 4.5 g/dL (ref 3.5–5.0)
Alkaline Phosphatase: 99 U/L (ref 38–126)
Anion gap: 13 (ref 5–15)
BUN: 10 mg/dL (ref 6–20)
CO2: 27 mmol/L (ref 22–32)
Calcium: 9.7 mg/dL (ref 8.9–10.3)
Chloride: 102 mmol/L (ref 98–111)
Creatinine, Ser: 0.84 mg/dL (ref 0.61–1.24)
GFR calc Af Amer: >60 mL/min (ref >60)
GFR calc non Af Amer: >60 mL/min (ref >60)
Glucose, Bld: 94 mg/dL (ref 70–99)
Potassium: 3.3 mmol/L — ABNORMAL LOW (ref 3.5–5.1)
Sodium: 142 mmol/L (ref 135–145)
Total Bilirubin: 0.6 mg/dL (ref 0.3–1.2)
Total Protein: 8.1 g/dL (ref 6.5–8.1)

## 2019-11-18 LAB — CBC
HCT: 42.2 % (ref 39.0–52.0)
Hemoglobin: 14.5 g/dL (ref 13.0–17.0)
MCH: 31 pg (ref 26.0–34.0)
MCHC: 34.4 g/dL (ref 30.0–36.0)
MCV: 90.2 fL (ref 80.0–100.0)
Platelets: 383 10*3/uL (ref 150–400)
RBC: 4.68 MIL/uL (ref 4.22–5.81)
RDW: 14.1 % (ref 11.5–15.5)
WBC: 12 10*3/uL — ABNORMAL HIGH (ref 4.0–10.5)
nRBC: 0 % (ref 0.0–0.2)

## 2019-11-18 LAB — TROPONIN I (HIGH SENSITIVITY): Troponin I (High Sensitivity): 10 ng/L (ref <18)

## 2019-11-18 NOTE — ED Triage Notes (Signed)
Pt brought in via ems from [facility].  Pt is deaf.  Pt may have had a seizure and came to er for an eval   Pt alert

## 2019-11-18 NOTE — ED Notes (Signed)
Brought in by EMS from [place], for seizure; pt asked bystanders to call for assistance, no seizure witnessed

## 2019-11-19 ENCOUNTER — Emergency Department
Admission: EM | Admit: 2019-11-19 | Discharge: 2019-11-19 | Disposition: A | Discharge status: Home / Self Care | Payer: Medicaid Other | Attending: Emergency Medicine | Admitting: Emergency Medicine

## 2019-11-19 DIAGNOSIS — M79672 Pain in left foot: Secondary | ICD-10-CM

## 2019-11-19 NOTE — ED Notes (Signed)
Pt may eat and drink per EDP 

## 2019-11-19 NOTE — ED Provider Notes (Signed)
[facility] Emergency Department Provider Note  ____________________________________________   First MD Initiated Contact with Patient 11/19/19 0245     (approximate)  I have reviewed the triage vital signs and the nursing notes.   HISTORY  Chief Complaint Seizures   HPI Cameron Hunt is a 59 y.o. male with below list of previous medical conditions presents to the emergency department secondary to nontraumatic left foot pain.  Triage notes will state that the patient call someone at [facility] because he thought he had a seizure.  Patient is currently denying that.  Patient denies any foot pain at present.  Patient denies any nausea no vomiting no chest pain or shortness of breath no headache.  Patient states that it is cold outside and would like to stay in the emergency department till 7 AM.        Past Medical History:  Diagnosis Date  . Deaf   . Hypertension     Patient Active Problem List   Diagnosis Date Noted  . Seizure ([place]) 09/24/2019  . Status epilepticus ([place])   . Endotracheally intubated   . 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 Alcohol abuse 05/26/2018  . Deaf 05/26/2018    No past surgical history on file.  Prior to Admission medications   Medication Sig Start Date End Date Taking? Authorizing Provider  amLODipine (NORVASC) 5 MG tablet Take 1 tablet (5 mg total) by mouth daily. 11/28/18 11/28/19  Cuthriell, Charline Bills, PA-C  amLODipine (NORVASC) 5 MG tablet Take 1 tablet (5 mg total) by mouth daily. 09/30/19   Nita Sells, MD  fluconazole (DIFLUCAN) 150 MG tablet Take 1 tablet (150 mg total) by mouth once a week. Patient not taking: Reported on 02/26/2019 11/28/18   Cuthriell, Charline Bills, PA-C  lidocaine (LIDODERM) 5 % Place 1 patch onto the skin every 12 (twelve) hours. Remove & Discard patch within 12 hours or as directed by MD.  Pershing Proud the patch off for 12 hours before applying a new one. 12/08/18 12/08/19  Hinda Kehr, MD    meloxicam (MOBIC) 15 MG tablet Take 1 tablet (15 mg total) by mouth daily. 11/28/18   Cuthriell, Charline Bills, PA-C  pantoprazole (PROTONIX) 40 MG tablet Take 1 tablet (40 mg total) by mouth at bedtime. 09/30/19   Nita Sells, MD    Allergies Patient has no known allergies.  Family History  Family history unknown: Yes    Social History Social History   Tobacco Use  . Smoking status: Current Some Day Smoker  . Smokeless tobacco: Never Used  Substance Use Topics  . Alcohol use: Yes    Comment: A lot of beer, some liquor  . Drug use: Not Currently    Review of Systems Constitutional: No fever/chills Eyes: No visual changes. ENT: No sore throat. Cardiovascular: Denies chest pain. Respiratory: Denies shortness of breath. Gastrointestinal: No abdominal pain.  No nausea, no vomiting.  No diarrhea.  No constipation. Genitourinary: Negative for dysuria. Musculoskeletal: Negative for neck pain.  Negative for back pain. Integumentary: Negative for rash. Neurological: Negative for headaches, focal weakness or numbness.   ____________________________________________   PHYSICAL EXAM:  VITAL SIGNS: ED Triage Vitals [11/18/19 2011]  Enc Vitals Group     BP 122/79     Pulse Rate 100     Resp 20     Temp 98.8 F (37.1 C)     Temp Source Oral     SpO2 97 %     Weight 83.9 kg (185 lb)  Height 1.829 m (6')     Head Circumference      Peak Flow      Pain Score      Pain Loc      Pain Edu?      Excl. in GC?     Constitutional: Alert and oriented.  Eyes: Conjunctivae are normal.  Head: Atraumatic. Mouth/Throat: Patient is wearing a mask. Neck: No stridor.  No meningeal signs.   Cardiovascular: Normal rate, regular rhythm. Good peripheral circulation. Grossly normal heart sounds. Respiratory: Normal respiratory effort.  No retractions. Gastrointestinal: Soft and nontender. No distention.  Musculoskeletal: No lower extremity tenderness nor edema. No gross  deformities of extremities. Neurologic:  Normal speech and language. No gross focal neurologic deficits are appreciated.  Skin:  Skin is warm, dry and intact. Psychiatric: Mood and affect are normal. Speech and behavior are normal.  ____________________________________________   LABS (all labs ordered are listed, but only abnormal results are displayed)  Labs Reviewed  COMPREHENSIVE METABOLIC PANEL - Abnormal; Notable for the following components:      Result Value   Potassium 3.3 (*)    AST 51 (*)    All other components within normal limits  CBC - Abnormal; Notable for the following components:   WBC 12.0 (*)    All other components within normal limits  TROPONIN I (HIGH SENSITIVITY)     Procedures   ____________________________________________   INITIAL IMPRESSION / MDM / ASSESSMENT AND PLAN / ED COURSE  As part of my medical decision making, I reviewed the following data within the electronic MEDICAL RECORD NUMBER  59 year old male presenting to the emergency department with above-stated history and physical exam.  Patient's main complaint at present is his request to stay in the emergency department till 7 AM because it is "cold outside".  Patient will be allowed to remain in the ED lobby.  Patient given something to eat in the emergency department as well ____________________________________________  FINAL CLINICAL IMPRESSION(S) / ED DIAGNOSES  Final diagnoses:  Left foot pain     MEDICATIONS GIVEN DURING THIS VISIT:  Medications - No data to display   ED Discharge Orders    None      *Please note:  Cameron Hunt was evaluated in Emergency Department on 11/19/2019 for the symptoms described in the history of present illness. He was evaluated in the context of the global COVID-19 pandemic, which necessitated consideration that the patient might be at risk for infection with the SARS-CoV-2 virus that causes COVID-19. Institutional protocols and algorithms that  pertain to the evaluation of patients at risk for COVID-19 are in a state of rapid change based on information released by regulatory bodies including the CDC and federal and state organizations. These policies and algorithms were followed during the patient's care in the ED.  Some ED evaluations and interventions may be delayed as a result of limited staffing during the pandemic.*  Note:  This document was prepared using Dragon voice recognition software and may include unintentional dictation errors.   Darci Current, MD 11/19/19 [phone]

## 2019-11-19 NOTE — ED Notes (Signed)
Patient discharged to home per MD order. Patient in stable condition, and deemed medically cleared by ED provider for discharge. Discharge instructions reviewed with patient/family using "Teach Back"; verbalized understanding of medication education and administration, and information about follow-up care. Denies further concerns. ° °

## 2019-11-19 NOTE — ED Notes (Signed)
Pt given crackers, juice, soda, peanut butter and applesauce. Pt tolerating well

## 2019-11-19 NOTE — ED Notes (Signed)
Using ASL on interpreter device, this RN asked pt questions. Per interpreter, pt was often answering with contradictory answers. Pt would answer yes to hitting face, then no. Pt would state that he has leg pain but could not remember how it happened. EDP in room at present with interpreter in use. Pt state he felt dizzy, and somehow hurt his foot.

## 2019-11-27 ENCOUNTER — Emergency Department
Admission: EM | Admit: 2019-11-27 | Discharge: 2019-11-27 | Disposition: A | Discharge status: Home / Self Care | Payer: Medicaid Other | Attending: Emergency Medicine | Admitting: Emergency Medicine

## 2019-11-27 ENCOUNTER — Other Ambulatory Visit: Payer: Self-pay

## 2019-11-27 DIAGNOSIS — G8929 Other chronic pain: Secondary | ICD-10-CM | POA: Diagnosis not present

## 2019-11-27 DIAGNOSIS — I10 Essential (primary) hypertension: Secondary | ICD-10-CM | POA: Diagnosis not present

## 2019-11-27 DIAGNOSIS — Z5900 Homelessness unspecified: Secondary | ICD-10-CM

## 2019-11-27 DIAGNOSIS — F1020 Alcohol dependence, uncomplicated: Secondary | ICD-10-CM | POA: Insufficient documentation

## 2019-11-27 DIAGNOSIS — F17210 Nicotine dependence, cigarettes, uncomplicated: Secondary | ICD-10-CM | POA: Insufficient documentation

## 2019-11-27 NOTE — ED Triage Notes (Addendum)
Pt comes EMS after being found intoxicated with many beers around him. Pt is homeless. Pt c/o left leg pain after playing basketball. Pt states he had 5 beers. Pt also states he has had trouble walking on that leg for about 20 years. Pt did not call 911. Someone called for him.

## 2019-11-27 NOTE — ED Provider Notes (Signed)
[facility] Emergency Department Provider Note       Time seen: ----------------------------------------- 3:06 PM on 11/27/2019 -----------------------------------------   I have reviewed the triage vital signs and the nursing notes.  HISTORY   Chief Complaint No chief complaint on file.    HPI Cameron Hunt is a 59 y.o. male with a history of deafness, homelessness, alcohol use disorder, seizures who presents to the ED for intoxication.  Patient arrives by EMS after being found intoxicated and homeless with multiple beers around him.  He was complaining of left leg pain after playing basketball.  Patient states he had 5 beers today and has had trouble walking on the left leg for 20 years.  Patient did not call 911, someone called 911 for him.  Discomfort is 8 out of 10.  Past Medical History:  Diagnosis Date  . Deaf   . Hypertension     Patient Active Problem List   Diagnosis Date Noted  . Seizure ([facility]) 09/24/2019  . Status epilepticus ([facility])   . Endotracheally intubated   . 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 Alcohol abuse 05/26/2018  . Deaf 05/26/2018    No past surgical history on file.  Allergies Patient has no known allergies.  Social History Social History   Tobacco Use  . Smoking status: Current Some Day Smoker  . Smokeless tobacco: Never Used  Substance Use Topics  . Alcohol use: Yes    Comment: A lot of beer, some liquor  . Drug use: Not Currently    Review of Systems Constitutional: Negative for fever. Cardiovascular: Negative for chest pain. Respiratory: Negative for shortness of breath. Gastrointestinal: Negative for abdominal pain, vomiting and diarrhea. Musculoskeletal: Positive for leg pain Skin: Negative for rash. Neurological: Negative for headaches, focal weakness or numbness. Psychiatric: Positive for alcohol use disorder, homelessness  All systems negative/normal/unremarkable except as stated in the  HPI  ____________________________________________   PHYSICAL EXAM:  VITAL SIGNS: ED Triage Vitals  Enc Vitals Group     BP      Pulse      Resp      Temp      Temp src      SpO2      Weight      Height      Head Circumference      Peak Flow      Pain Score      Pain Loc      Pain Edu?      Excl. in GC?    Constitutional: Alert, no acute distress Eyes: Conjunctivae are normal. Normal extraocular movements. ENT      Head: Normocephalic and atraumatic.      Nose: No congestion/rhinnorhea.      Mouth/Throat: Mucous membranes are moist.      Neck: No stridor. Cardiovascular: Normal rate, regular rhythm. No murmurs, rubs, or gallops. Respiratory: Normal respiratory effort without tachypnea nor retractions. Breath sounds are clear and equal bilaterally. No wheezes/rales/rhonchi. Gastrointestinal: Soft and nontender. Normal bowel sounds Musculoskeletal: Nontender with normal range of motion in extremities. No lower extremity tenderness nor edema.  Patient's feet and ankles were saturated and what is likely alcohol.  Good distal pulses are noted. Neurologic:  No gross focal neurologic deficits are appreciated.  Skin:  Skin is warm, dry and intact. No rash noted. Psychiatric: Mood and affect are normal.  Patient speaks via sign language ____________________________________________  ED COURSE:  As part of my medical decision making, I reviewed the following data  within the [facility] History obtained from family if available, nursing notes, old chart and ekg, as well as notes from prior ED visits. Patient presented for screening exam, patient denies any complaints currently but is intoxicated.  ,Procedures  Cameron Hunt was evaluated in Emergency Department on 11/27/2019 for the symptoms described in the history of present illness. He was evaluated in the context of the global COVID-19 pandemic, which necessitated consideration that the patient might be at risk  for infection with the SARS-CoV-2 virus that causes COVID-19. Institutional protocols and algorithms that pertain to the evaluation of patients at risk for COVID-19 are in a state of rapid change based on information released by regulatory bodies including the CDC and federal and state organizations. These policies and algorithms were followed during the patient's care in the ED.  ____________________________________________   DIFFERENTIAL DIAGNOSIS   Alcohol use disorder, homelessness, medical screening exam, chronic leg pain  FINAL ASSESSMENT AND PLAN  Alcohol use disorder, chronic pain   Plan: The patient had presented for intoxication and homelessness.  Patient is in no distress, we changed him out of his wet shoes and socks into dry articles of clothing.  He has had something to eat and drink and denies complaints.   Laurence Aly, MD    Note: This note was generated in part or whole with voice recognition software. Voice recognition is usually quite accurate but there are transcription errors that can and very often do occur. I apologize for any typographical errors that were not detected and corrected.     Earleen Newport, MD 11/27/19 [phone]

## 2019-11-27 NOTE — ED Notes (Signed)
Taxi called for patient to go to homeless shelter. Pt agreeable to that.

## 2019-11-27 NOTE — ED Notes (Signed)
Interpreter used for interactions with this RN and MD. Pt given a meal tray.

## 2019-11-29 ENCOUNTER — Other Ambulatory Visit: Payer: Self-pay

## 2019-11-29 ENCOUNTER — Emergency Department: Payer: Medicaid Other

## 2019-11-29 ENCOUNTER — Emergency Department
Admission: EM | Admit: 2019-11-29 | Discharge: 2019-11-30 | Disposition: A | Discharge status: Home / Self Care | Payer: Medicaid Other | Attending: Emergency Medicine | Admitting: Emergency Medicine

## 2019-11-29 ENCOUNTER — Encounter: Payer: Self-pay | Admitting: Emergency Medicine

## 2019-11-29 DIAGNOSIS — Y929 Unspecified place or not applicable: Secondary | ICD-10-CM | POA: Diagnosis not present

## 2019-11-29 DIAGNOSIS — Z79899 Other long term (current) drug therapy: Secondary | ICD-10-CM | POA: Diagnosis not present

## 2019-11-29 DIAGNOSIS — R569 Unspecified convulsions: Secondary | ICD-10-CM | POA: Diagnosis not present

## 2019-11-29 DIAGNOSIS — I10 Essential (primary) hypertension: Secondary | ICD-10-CM | POA: Insufficient documentation

## 2019-11-29 DIAGNOSIS — Y939 Activity, unspecified: Secondary | ICD-10-CM | POA: Diagnosis not present

## 2019-11-29 DIAGNOSIS — F17200 Nicotine dependence, unspecified, uncomplicated: Secondary | ICD-10-CM | POA: Diagnosis not present

## 2019-11-29 DIAGNOSIS — W19XXXA Unspecified fall, initial encounter: Secondary | ICD-10-CM | POA: Diagnosis not present

## 2019-11-29 DIAGNOSIS — Y999 Unspecified external cause status: Secondary | ICD-10-CM | POA: Insufficient documentation

## 2019-11-29 DIAGNOSIS — M25572 Pain in left ankle and joints of left foot: Secondary | ICD-10-CM | POA: Diagnosis not present

## 2019-11-29 DIAGNOSIS — F10929 Alcohol use, unspecified with intoxication, unspecified: Secondary | ICD-10-CM | POA: Insufficient documentation

## 2019-11-29 HISTORY — DX: Alcohol abuse, uncomplicated: F10.10

## 2019-11-29 LAB — URINALYSIS, ROUTINE W REFLEX MICROSCOPIC
Bilirubin Urine: NEGATIVE
Glucose, UA: NEGATIVE mg/dL
Hgb urine dipstick: NEGATIVE
Ketones, ur: NEGATIVE mg/dL
Leukocytes,Ua: NEGATIVE
Nitrite: NEGATIVE
Protein, ur: NEGATIVE mg/dL
Specific Gravity, Urine: 1.002 — ABNORMAL LOW (ref 1.005–1.030)
pH: 5 (ref 5.0–8.0)

## 2019-11-29 LAB — CBC WITH DIFFERENTIAL/PLATELET
Abs Immature Granulocytes: 0.03 10*3/uL (ref 0.00–0.07)
Basophils Absolute: 0.1 10*3/uL (ref 0.0–0.1)
Basophils Relative: 2 %
Eosinophils Absolute: 0.3 10*3/uL (ref 0.0–0.5)
Eosinophils Relative: 3 %
HCT: 47.6 % (ref 39.0–52.0)
Hemoglobin: 15.9 g/dL (ref 13.0–17.0)
Immature Granulocytes: 0 %
Lymphocytes Relative: 31 %
Lymphs Abs: 3 10*3/uL (ref 0.7–4.0)
MCH: 31 pg (ref 26.0–34.0)
MCHC: 33.4 g/dL (ref 30.0–36.0)
MCV: 92.8 fL (ref 80.0–100.0)
Monocytes Absolute: 0.8 10*3/uL (ref 0.1–1.0)
Monocytes Relative: 9 %
Neutro Abs: 5.4 10*3/uL (ref 1.7–7.7)
Neutrophils Relative %: 55 %
Platelets: 345 10*3/uL (ref 150–400)
RBC: 5.13 MIL/uL (ref 4.22–5.81)
RDW: 15.3 % (ref 11.5–15.5)
WBC: 9.7 10*3/uL (ref 4.0–10.5)
nRBC: 0 % (ref 0.0–0.2)

## 2019-11-29 LAB — BASIC METABOLIC PANEL
Anion gap: 10 (ref 5–15)
BUN: 8 mg/dL (ref 6–20)
CO2: 25 mmol/L (ref 22–32)
Calcium: 9.3 mg/dL (ref 8.9–10.3)
Chloride: 105 mmol/L (ref 98–111)
Creatinine, Ser: 0.42 mg/dL — ABNORMAL LOW (ref 0.61–1.24)
GFR calc Af Amer: >60 mL/min (ref >60)
GFR calc non Af Amer: >60 mL/min (ref >60)
Glucose, Bld: 82 mg/dL (ref 70–99)
Potassium: 3.8 mmol/L (ref 3.5–5.1)
Sodium: 140 mmol/L (ref 135–145)

## 2019-11-29 MED ORDER — ACETAMINOPHEN 325 MG PO TABS
650.0000 mg | ORAL_TABLET | Freq: Once | ORAL | Status: AC
  Administered 2019-11-29: 20:00:00 650 mg via ORAL
  Filled 2019-11-29: qty 2

## 2019-11-29 NOTE — ED Notes (Signed)
Video ASL interpreter used to introduce self to patient.  Patient stating he is very hungry.  This RN explained patient will need to go to his CT scan, get the results back, and then when reviewed by the MD and approved we will get him a meal.  Patient understands, has call bell in place, and denies other needs and questions answered at this time.

## 2019-11-29 NOTE — ED Triage Notes (Signed)
Pt in via [facility] from unknown business, found to be intoxicated, complaints of left ankle pain; reports playing basketball and falling.   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

## 2019-11-29 NOTE — ED Notes (Signed)
This RN spoke to [place] via phone, this daughter lives in [place], states will attempt to get in touch with [place] to help arrange ride for patient.

## 2019-11-29 NOTE — ED Notes (Signed)
Patient transported to CT by CT staff 

## 2019-11-29 NOTE — ED Notes (Signed)
Attempted to call patient's daughter's without answer at this time for discharge.  Will continue to try.

## 2019-11-29 NOTE — ED Notes (Signed)
MD requesting patient to be ambulated.  Pt ambulated in room with this RN and Berline Lopes, Charity fundraiser.  Pt ambulatory with steady gait without assistance.  MD notified.

## 2019-11-29 NOTE — Discharge Instructions (Addendum)
Your CT scans were negative but do show some old strokes.  Your x-rays were negative for fractures.  Your labs are reassuring.  Your blood pressure slightly elevated they can follows up with your primary care doctor.  Return the ER for any other concerns   IMPRESSION: 1. No acute intracranial abnormality. 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 2. No significant abnormality of the cervical spine.
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
## 2019-11-30 NOTE — ED Notes (Signed)
This RN spoke to [place] via phone after she called ED.  Informed daughter that patient was seen in ED and ready for discharge.  Kandis Mannan was given ED phone number and states will work on making arrangements to pick up patient for discharge and call back in approx 15 minutes.

## 2019-11-30 NOTE — ED Notes (Signed)
This RN able to contact daughter in [place] who is unable to pick up patient or have any information about other daughter that lives close by.  RN continuously attempting Cameron Hunt's number without answer.

## 2020-04-15 ENCOUNTER — Emergency Department: Payer: Medicaid Other

## 2020-04-15 ENCOUNTER — Other Ambulatory Visit: Payer: Self-pay

## 2020-04-15 ENCOUNTER — Emergency Department
Admission: EM | Admit: 2020-04-15 | Discharge: 2020-04-15 | Disposition: A | Discharge status: Other Acute Inpatient Hospital | Payer: Medicaid Other | Attending: Student | Admitting: Student

## 2020-04-15 ENCOUNTER — Encounter: Payer: Self-pay | Admitting: Emergency Medicine

## 2020-04-15 DIAGNOSIS — Z79899 Other long term (current) drug therapy: Secondary | ICD-10-CM | POA: Diagnosis not present

## 2020-04-15 DIAGNOSIS — I10 Essential (primary) hypertension: Secondary | ICD-10-CM | POA: Diagnosis not present

## 2020-04-15 DIAGNOSIS — G40901 Epilepsy, unspecified, not intractable, with status epilepticus: Secondary | ICD-10-CM | POA: Insufficient documentation

## 2020-04-15 DIAGNOSIS — R4182 Altered mental status, unspecified: Secondary | ICD-10-CM | POA: Diagnosis present

## 2020-04-15 DIAGNOSIS — J9601 Acute respiratory failure with hypoxia: Secondary | ICD-10-CM | POA: Diagnosis not present

## 2020-04-15 DIAGNOSIS — Z20822 Contact with and (suspected) exposure to covid-19: Secondary | ICD-10-CM | POA: Insufficient documentation

## 2020-04-15 DIAGNOSIS — F17210 Nicotine dependence, cigarettes, uncomplicated: Secondary | ICD-10-CM | POA: Diagnosis not present

## 2020-04-15 DIAGNOSIS — R569 Unspecified convulsions: Secondary | ICD-10-CM

## 2020-04-15 LAB — URINE DRUG SCREEN, QUALITATIVE (ARMC ONLY)
Amphetamines, Ur Screen: NOT DETECTED
Barbiturates, Ur Screen: NOT DETECTED
Benzodiazepine, Ur Scrn: POSITIVE — AB
Cannabinoid 50 Ng, Ur ~~LOC~~: NOT DETECTED
Cocaine Metabolite,Ur ~~LOC~~: NOT DETECTED
MDMA (Ecstasy)Ur Screen: NOT DETECTED
Methadone Scn, Ur: NOT DETECTED
Opiate, Ur Screen: NOT DETECTED
Phencyclidine (PCP) Ur S: NOT DETECTED
Tricyclic, Ur Screen: NOT DETECTED

## 2020-04-15 LAB — CBC WITH DIFFERENTIAL/PLATELET
Abs Immature Granulocytes: 0.06 10*3/uL (ref 0.00–0.07)
Basophils Absolute: 0.2 10*3/uL — ABNORMAL HIGH (ref 0.0–0.1)
Basophils Relative: 1 %
Eosinophils Absolute: 0.1 10*3/uL (ref 0.0–0.5)
Eosinophils Relative: 1 %
HCT: 46.9 % (ref 39.0–52.0)
Hemoglobin: 15.5 g/dL (ref 13.0–17.0)
Immature Granulocytes: 0 %
Lymphocytes Relative: 18 %
Lymphs Abs: 2.9 10*3/uL (ref 0.7–4.0)
MCH: 32 pg (ref 26.0–34.0)
MCHC: 33 g/dL (ref 30.0–36.0)
MCV: 96.9 fL (ref 80.0–100.0)
Monocytes Absolute: 1 10*3/uL (ref 0.1–1.0)
Monocytes Relative: 6 %
Neutro Abs: 12 10*3/uL — ABNORMAL HIGH (ref 1.7–7.7)
Neutrophils Relative %: 74 %
Platelets: 280 10*3/uL (ref 150–400)
RBC: 4.84 MIL/uL (ref 4.22–5.81)
RDW: 15 % (ref 11.5–15.5)
WBC: 16.2 10*3/uL — ABNORMAL HIGH (ref 4.0–10.5)
nRBC: 0 % (ref 0.0–0.2)

## 2020-04-15 LAB — BLOOD GAS, ARTERIAL
Acid-base deficit: 2.3 mmol/L — ABNORMAL HIGH (ref 0.0–2.0)
Bicarbonate: 25.4 mmol/L (ref 20.0–28.0)
FIO2: 1
MECHVT: 475 mL
Mechanical Rate: 20
O2 Saturation: 99.5 %
PEEP: 5 cmH2O
Patient temperature: 37
RATE: 20 resp/min
pCO2 arterial: 54 mmHg — ABNORMAL HIGH (ref 32.0–48.0)
pH, Arterial: 7.28 — ABNORMAL LOW (ref 7.350–7.450)
pO2, Arterial: 181 mmHg — ABNORMAL HIGH (ref 83.0–108.0)

## 2020-04-15 LAB — SARS CORONAVIRUS 2 BY RT PCR (HOSPITAL ORDER, PERFORMED IN ~~LOC~~ HOSPITAL LAB): SARS Coronavirus 2: NEGATIVE

## 2020-04-15 LAB — URINALYSIS, COMPLETE (UACMP) WITH MICROSCOPIC
Bilirubin Urine: NEGATIVE
Glucose, UA: 150 mg/dL — AB
Ketones, ur: NEGATIVE mg/dL
Leukocytes,Ua: NEGATIVE
Nitrite: NEGATIVE
Protein, ur: 100 mg/dL — AB
Specific Gravity, Urine: 1.01 (ref 1.005–1.030)
pH: 5 (ref 5.0–8.0)

## 2020-04-15 LAB — SALICYLATE LEVEL
Salicylate Lvl: UNDETERMINED mg/dL (ref 7.0–30.0)
Salicylate Lvl: <7 mg/dL — ABNORMAL LOW (ref 7.0–30.0)

## 2020-04-15 LAB — COMPREHENSIVE METABOLIC PANEL
ALT: 44 U/L (ref 0–44)
AST: 50 U/L — ABNORMAL HIGH (ref 15–41)
Albumin: 3.9 g/dL (ref 3.5–5.0)
Alkaline Phosphatase: 94 U/L (ref 38–126)
Anion gap: 11 (ref 5–15)
BUN: 10 mg/dL (ref 6–20)
CO2: 22 mmol/L (ref 22–32)
Calcium: 8 mg/dL — ABNORMAL LOW (ref 8.9–10.3)
Chloride: 104 mmol/L (ref 98–111)
Creatinine, Ser: 0.8 mg/dL (ref 0.61–1.24)
GFR calc Af Amer: >60 mL/min (ref >60)
GFR calc non Af Amer: >60 mL/min (ref >60)
Glucose, Bld: 146 mg/dL — ABNORMAL HIGH (ref 70–99)
Potassium: 3.5 mmol/L (ref 3.5–5.1)
Sodium: 137 mmol/L (ref 135–145)
Total Bilirubin: 0.9 mg/dL (ref 0.3–1.2)
Total Protein: 7.3 g/dL (ref 6.5–8.1)

## 2020-04-15 LAB — ACETAMINOPHEN LEVEL
Acetaminophen (Tylenol), Serum: UNDETERMINED ug/mL (ref 10–30)
Acetaminophen (Tylenol), Serum: <10 ug/mL — ABNORMAL LOW (ref 10–30)

## 2020-04-15 LAB — ETHANOL: Alcohol, Ethyl (B): <10 mg/dL (ref <10)

## 2020-04-15 LAB — GLUCOSE, CAPILLARY: Glucose-Capillary: 200 mg/dL — ABNORMAL HIGH (ref 70–99)

## 2020-04-15 LAB — CK: Total CK: 184 U/L (ref 49–397)

## 2020-04-15 MED ORDER — ROCURONIUM BROMIDE 50 MG/5ML IV SOLN
INTRAVENOUS | Status: AC | PRN
  Administered 2020-04-15: 100 mg via INTRAVENOUS

## 2020-04-15 MED ORDER — PROPOFOL 1000 MG/100ML IV EMUL
INTRAVENOUS | Status: AC
  Filled 2020-04-15: qty 100

## 2020-04-15 MED ORDER — PROPOFOL 1000 MG/100ML IV EMUL
INTRAVENOUS | Status: AC | PRN
  Administered 2020-04-15: 10 ug/kg/min via INTRAVENOUS
  Administered 2020-04-15: 30 mg via INTRAVENOUS
  Administered 2020-04-15: 20 mg via INTRAVENOUS

## 2020-04-15 MED ORDER — FENTANYL 2500MCG IN NS 250ML (10MCG/ML) PREMIX INFUSION
50.0000 ug/h | INTRAVENOUS | Status: DC
  Administered 2020-04-15: 50 ug/h via INTRAVENOUS
  Filled 2020-04-15: qty 250

## 2020-04-15 MED ORDER — SODIUM CHLORIDE 0.9 % IV SOLN
INTRAVENOUS | Status: AC | PRN
  Administered 2020-04-15 (×2): 1000 mL via INTRAVENOUS

## 2020-04-15 MED ORDER — FENTANYL CITRATE (PF) 100 MCG/2ML IJ SOLN
INTRAMUSCULAR | Status: AC | PRN
  Administered 2020-04-15: 50 ug via INTRAVENOUS
  Administered 2020-04-15: 75 ug via INTRAVENOUS

## 2020-04-15 MED ORDER — LEVETIRACETAM IN NACL 1000 MG/100ML IV SOLN
1000.0000 mg | Freq: Once | INTRAVENOUS | Status: AC
  Administered 2020-04-15: 1000 mg via INTRAVENOUS
  Filled 2020-04-15: qty 100

## 2020-04-15 MED ORDER — ETOMIDATE 2 MG/ML IV SOLN
INTRAVENOUS | Status: AC | PRN
  Administered 2020-04-15: 30 mg via INTRAVENOUS

## 2020-04-15 NOTE — ED Notes (Signed)
emtala reviewed by this RN 

## 2020-04-15 NOTE — ED Provider Notes (Signed)
[facility] Emergency Department Provider Note  ____________________________________________   First MD Initiated Contact with Patient 04/15/20 1618     (approximate)  I have reviewed the triage vital signs and the nursing notes.  History  Chief Complaint Seizures and Unreponsive    HPI Cameron Hunt is a 59 y.o. male with a history of alcohol abuse, alcohol withdrawal seizures, HTN, deafness who presents to the ER via EMS found down by bystanders, unresponsive, with witnessed seizure activity by EMS.  Per report, patient was walking down the road with a friend when someone drove by and noticed the patient was on the ground, found to be unresponsive.  With EMS the patient was found to have a left gaze deviation and had 3 generalized clonic tonic seizures in transport.  Initially received 2 mg intranasal Versed, and then additional 4 mg IV.  Administration of the benzodiazepines only briefly stopped seizure activity before recurrence.  He arrives to the ER unresponsive, tachypneic, hypoxic to upper 80s/low 90s on NRB.  Withdrawals to pain in all fours, otherwise unresponsive.  No further seizure activity witnessed.  Not protecting his airway.  Unfortunately, the patient's friend that he was walking with when he first seized is also deaf and was unable to provide EMS with any further history.   Past Medical Hx Past Medical History:  Diagnosis Date  . Deaf   . ETOH abuse   . Hypertension     Problem List Patient Active Problem List   Diagnosis Date Noted  . Seizure (HCC) 09/24/2019  . Status epilepticus (HCC)   . Endotracheally intubated   . 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 Alcohol abuse 05/26/2018  . Deaf 05/26/2018    Past Surgical Hx History reviewed. No pertinent surgical history.  Medications Prior to Admission medications   Medication Sig Start Date End Date Taking? Authorizing Provider  amLODipine (NORVASC) 5 MG tablet Take 1 tablet  (5 mg total) by mouth daily. 11/28/18 11/28/19  Cuthriell, Delorise Royals, PA-C  amLODipine (NORVASC) 5 MG tablet Take 1 tablet (5 mg total) by mouth daily. 09/30/19   Rhetta Mura, MD  fluconazole (DIFLUCAN) 150 MG tablet Take 1 tablet (150 mg total) by mouth once a week. Patient not taking: Reported on 02/26/2019 11/28/18   Cuthriell, Delorise Royals, PA-C  meloxicam (MOBIC) 15 MG tablet Take 1 tablet (15 mg total) by mouth daily. 11/28/18   Cuthriell, Delorise Royals, PA-C  pantoprazole (PROTONIX) 40 MG tablet Take 1 tablet (40 mg total) by mouth at bedtime. 09/30/19   Rhetta Mura, MD    Allergies Patient has no known allergies.  Family Hx Family History  Family history unknown: Yes    Social Hx Social History   Tobacco Use  . Smoking status: Current Some Day Smoker    Types: Cigarettes  . Smokeless tobacco: Never Used  Vaping Use  . Vaping Use: Never assessed  Substance Use Topics  . Alcohol use: Yes    Comment: ETOH Dependence   . Drug use: Not Currently     Review of Systems Unable to obtain due to patient's altered mental status, clinical presentation.  Physical Exam  Vital Signs: ED Triage Vitals  Enc Vitals Group     BP 04/15/20 1616 (!) 196/132     Pulse Rate 04/15/20 1607 (!) 137     Resp 04/15/20 1607 20     Temp --      Temp src --      SpO2 04/15/20 1607 95 %  Weight 04/15/20 1621 154 lb 5.2 oz (70 kg)     Height 04/15/20 1621 6\' 2"  (1.88 m)     Head Circumference --      Peak Flow --      Pain Score --      Pain Loc --      Pain Edu? --      Excl. in [place]? --     Constitutional: Unresponsive.  Withdraws to pain in all fours. Head: Normocephalic. Atraumatic. Eyes: Conjunctivae slightly injected. Sclera anicteric. Pupils equal and symmetric. Nose: No masses or lesions. No congestion or rhinorrhea. Mouth/Throat: Wearing mask.  Neck: No stridor. Trachea midline.  Cardiovascular: Normal rate, regular rhythm. Extremities well  perfused. Respiratory: On arrival into the 30s and 40s, with coarse lung sounds bilaterally, particularly on the right.  Oxygen saturation upper 80s/low 90s on NRB. Gastrointestinal: Soft. Non-distended. Non-tender.  Genitourinary: Deferred. Musculoskeletal: No lower extremity edema. No deformities. Neurologic: Pupils equal and reactive.  No return to baseline.  Withdraws to pain in all fours. GCS 5 (E-1, V-NT, M-4). Of note, patient is deaf at baseline.  Skin: Skin is warm, dry and intact. No rash noted. Psychiatric: Mood and affect are appropriate for situation.  EKG  Personally reviewed and interpreted by myself.   Date: 04/15/2020 Time: 1600 Rate: 138 Rhythm: Sinus Axis: Normal Intervals: Within normal limits Sinus tachycardia No STEMI    Radiology  Personally reviewed available imaging myself.   CT head/CS - IMPRESSION:  1. No acute intracranial pathology.  2. Small-vessel white matter disease and unchanged encephalomalacia  of the posterior left frontal lobe and left temporal pole.  3. No fracture or static subluxation of the cervical spine.  4. Mild multilevel disc space height loss and osteophytosis.    CXR - FINDINGS:  Cardiac shadows within normal limits. Endotracheal tube and gastric  catheter are seen. Gastric catheter shows the proximal side port to  be in the distal esophagus. This could be advanced several cm. The  lungs are clear bilaterally. No acute bony abnormality is noted.   IMPRESSION:  Tubes and lines as described above. Nasogastric catheter could be  advanced several cm further into the stomach.    Procedures  Procedure(s) performed (including critical care):  .Critical Care Performed by: Lilia Pro., MD Authorized by: Lilia Pro., MD   Critical care provider statement:    Critical care time (minutes):  35   Critical care was time spent personally by me on the following activities:  Discussions with consultants, evaluation of  patient's response to treatment, examination of patient, ordering and performing treatments and interventions, ordering and review of laboratory studies, ordering and review of radiographic studies, pulse oximetry, re-evaluation of patient's condition, obtaining history from patient or surrogate and review of old charts Date/Time: 04/15/2020 4:38 PM Performed by: Lilia Pro., MD Pre-anesthesia Checklist: Patient identified, Patient being monitored, Emergency Drugs available, Timeout performed and Suction available Oxygen Delivery Method: Ambu bag Preoxygenation: Pre-oxygenation with 100% oxygen Induction Type: Rapid sequence Laryngoscope Size: Glidescope and 4 Tube size: 7.5 mm Number of attempts: 1 Placement Confirmation: ETT inserted through vocal cords under direct vision,  Positive ETCO2,  Breath sounds checked- equal and bilateral and CO2 detector Tube secured with: ETT holder        Initial Impression / Assessment and Plan / MDM / ED Course  59 y.o. male with a history of alcohol abuse, alcohol withdrawal seizures, HTN, deafness who presents to the ER via EMS found down  by bystanders, unresponsive, with witnessed seizure activity by EMS.  Per report, patient was walking down the road with a friend when someone drove by and noticed the patient was on the ground, found to be unresponsive.  With EMS the patient was found to have a left gaze deviation and had 3 generalized clonic tonic seizures in transport.  Initially received 2 mg intranasal Versed, and then additional 4 mg IV.  Administration of the benzodiazepines only briefly stopped seizure activity before recurrence.    He arrives to the ER unresponsive, tachypneic, hypoxic to upper 80s/low 90s on NRB.  Concern for aspiration.  Withdrawals to pain in all fours, otherwise unresponsive.  No further seizure activity witnessed, but clearly not returning to baseline and not protecting his airway.  Decision made to intubate due to his  continued encephalopathy and for respiratory/airway protection.  Ddx: status epilepticus, alcohol withdrawal seizures (especially in the setting of his persistent tachycardia and hypertension), electrolyte abnormality, intracranial injury  Patient intubated as above due to his persistent altered mental status without return to baseline, and not protecting his airway.  Will obtain labs, imaging.  Patient on propofol drip.  Will load with Keppra as well.  Discussed case with Dr. Loretha Brasil, who agrees with the above plan of care thus far.  Also agrees with underlying concern for potential status, the patient will require transfer to have continuous EEG monitoring.  Updated daughter at bedside regarding patient's clinical status and current plan of care.  She voices understanding of this.  She does confirm that the patient is a fairly frequent drinker, states he typically drinks "as much as he can get ahold of" at a time.  Unclear when his last drink was.  Unfortunately the friend that he was with on EMS arrival was also deaf and unable to provide any further detail to them prior to transport.  In terms of transfer, patient's daughter requests [facility]  Will discuss with [facility] transfer center.  HR and BP improving with propofol, fentanyl, IV fluids. He is moving all extremities, given increased sedation PRN accordingly.   Electrolytes without actionable derangements.  Alcohol level is negative, which could be consistent with/suggestive of possible withdrawal seizure.  Mild acidosis and hypercarbia on initial ABG, consistent with his seizure and clinical history, increase tidal volume and respiratory rate with RT.  CT head negative for acute findings.  Patient accepted at [facility] for transfer.  Stable for transfer.  _______________________________   As part of my medical decision making I have reviewed available labs, radiology tests, reviewed old records/performed chart review, obtained additional history from  daughter at bedside & EMS, and discussed with consultants (Neurology).     Final Clinical Impression(s) / ED Diagnosis  Unresponsive Status epilepticus Acute hypoxic respiratory failure    Note:  This document was prepared using Dragon voice recognition software and may include unintentional dictation errors.   Miguel Aschoff., MD 04/15/20 Izell Macy

## 2020-04-15 NOTE — ED Triage Notes (Signed)
Pt via EMS from the side of the road. Pt was seen by a bystander having a tonic-clonic seizure. Per EMS, pt had a total of 3 seizures and unresponsive. EMS gave 2mg  Versed IM and 4mg  Versed IV. Upon arrival, pt is unresponsive on the NRB. EMS states 81% on RA. Pt has a hx of brain injury, heavy drinker, and diabetic.

## 2020-04-15 NOTE — ED Notes (Signed)
Pt daughter contacted due to belongings being left behind in room. Daughter asks for this nurse to throw away belongings, this nurse states that this nurse is not comfortable doing so and that she would be the one to do so. Belongings that were bagged prior to this nurse arriving, cane, and hat are taken to front and labeled awaiting daughter to pick up at this time.

## 2020-04-16 LAB — URINE CULTURE: Culture: NO GROWTH

## 2021-03-17 IMAGING — CT CT HEAD W/O CM
3 series · 15 of 46 positions shown, 18 images · non-contrast
Comparison: Head CT 11/10/2019

CLINICAL DATA: Fall with facial trauma

EXAM:
CT HEAD WITHOUT CONTRAST
CT MAXILLOFACIAL WITHOUT CONTRAST
TECHNIQUE: Multidetector CT imaging of the head and maxillofacial structures
were performed using the standard protocol without intravenous
contrast. Multiplanar CT image reconstructions of the maxillofacial
structures were also generated.

[Series 2: head wo · axial · 0.40mm/px · z∈[-137,-17]mm · 9 of 29 slices shown, 12 images]
[im 3/29  brain]
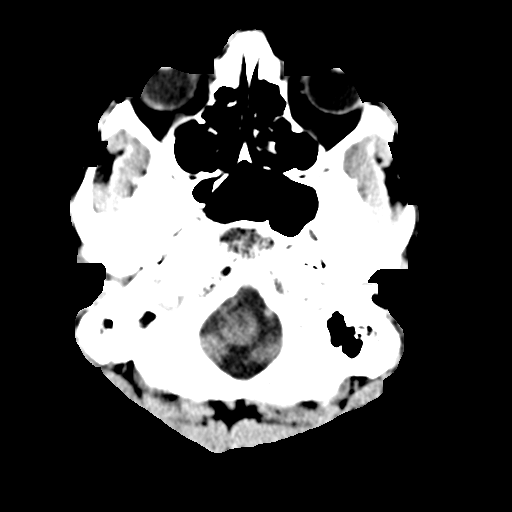
[im 3/29  bone]
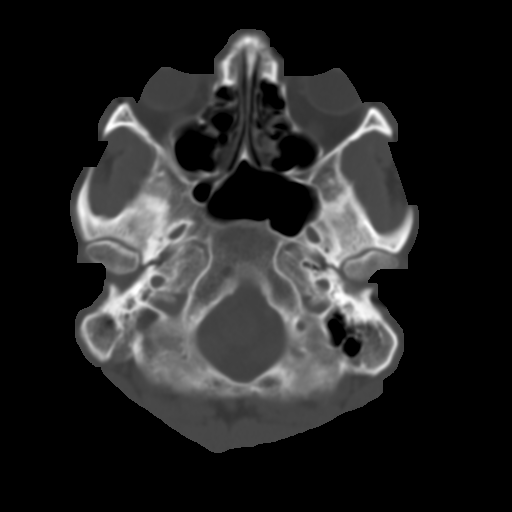
[im 6/29  brain]
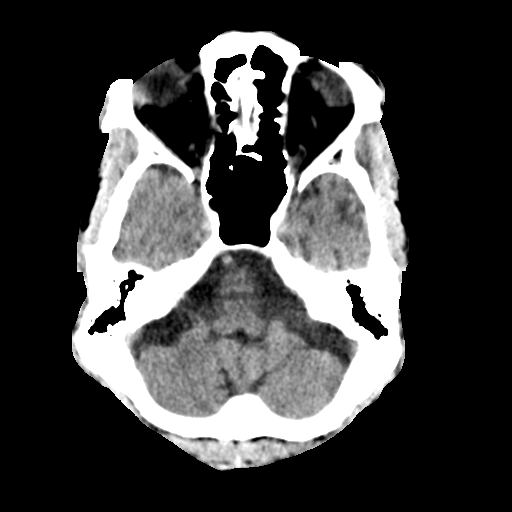
[im 9/29  brain]
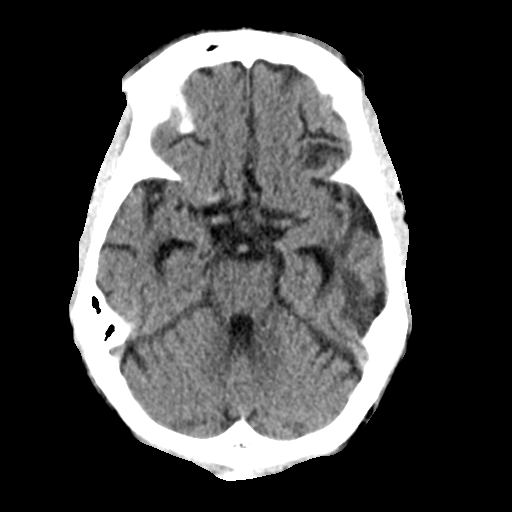
[im 12/29  brain]
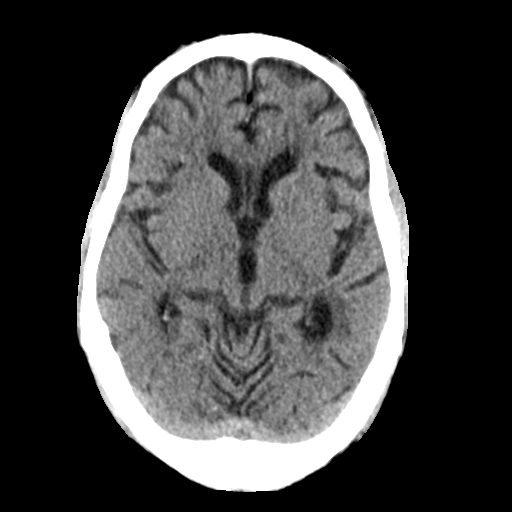
[im 15/29  brain]
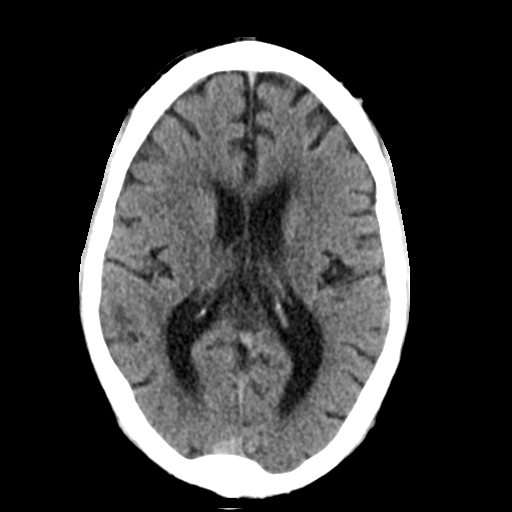
[im 15/29  bone]
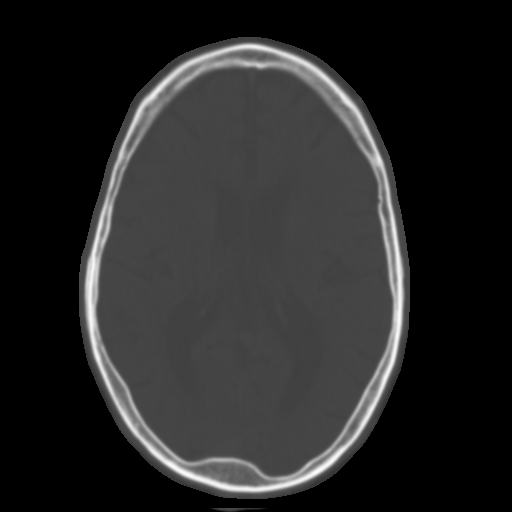
[im 18/29  brain]
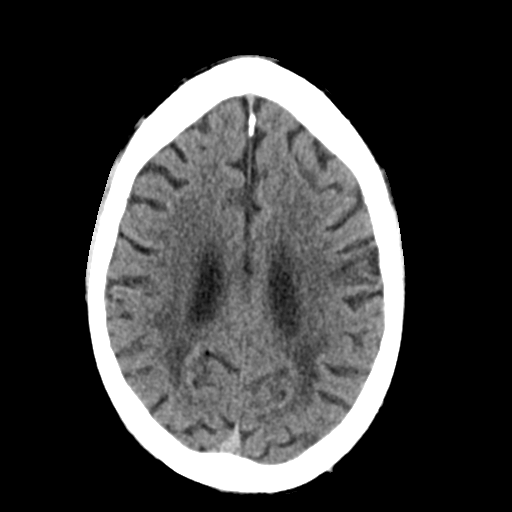
[im 21/29  brain]
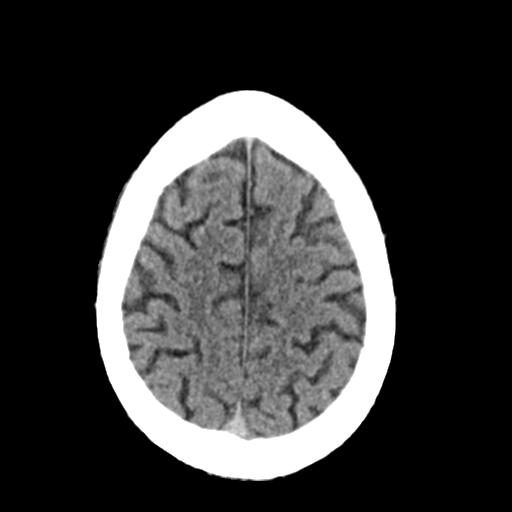
[im 24/29  brain]
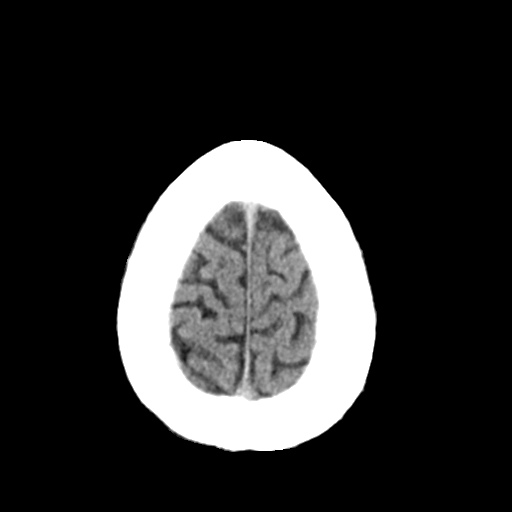
[im 27/29  brain]
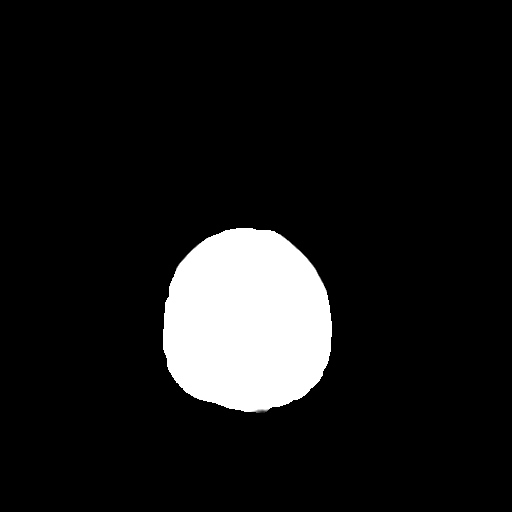
[im 27/29  bone]
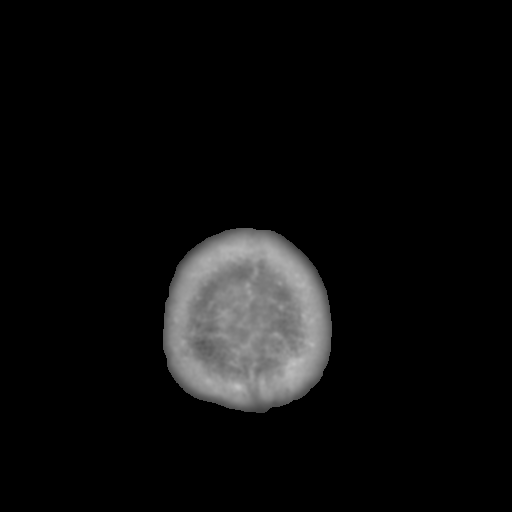

[Series 4: coronal soft tissue · coronal · 0.29mm/px · 3 of 64 slices shown]
[im 22/64  brain]
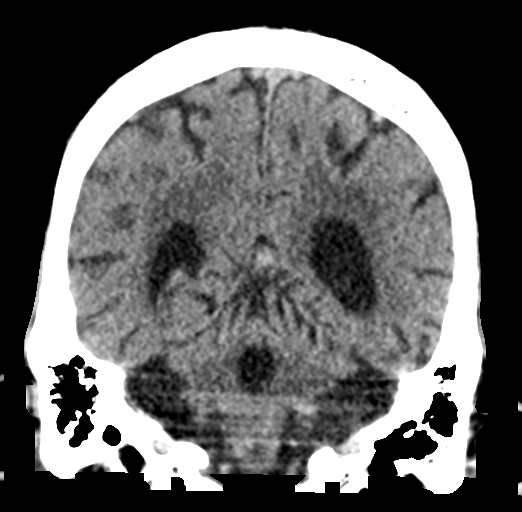
[im 29/64  brain]
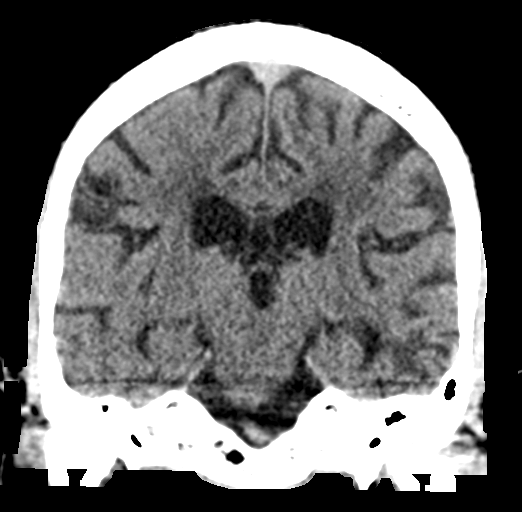
[im 36/64  brain]
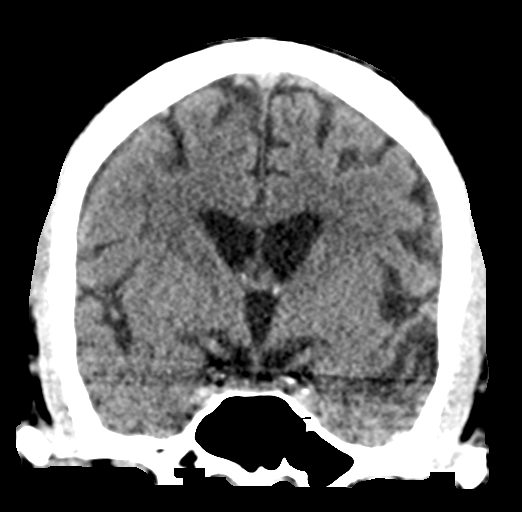

[Series 5: sagittal soft tissue · sagittal · 0.29mm/px · 3 of 46 slices shown]
[im 16/46  brain]
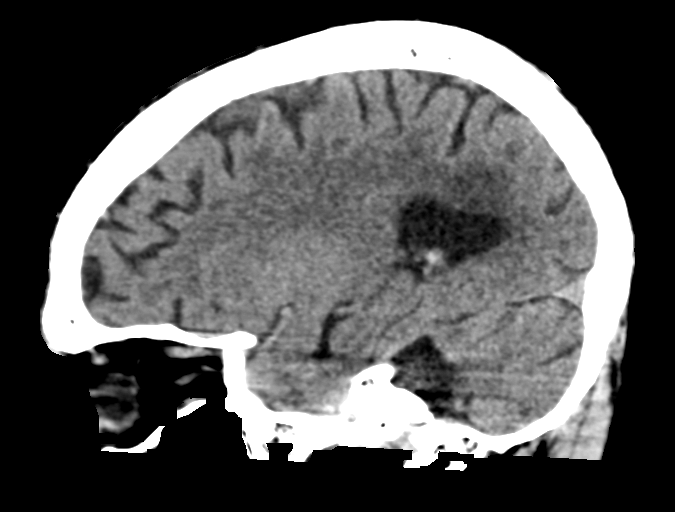
[im 23/46  brain]
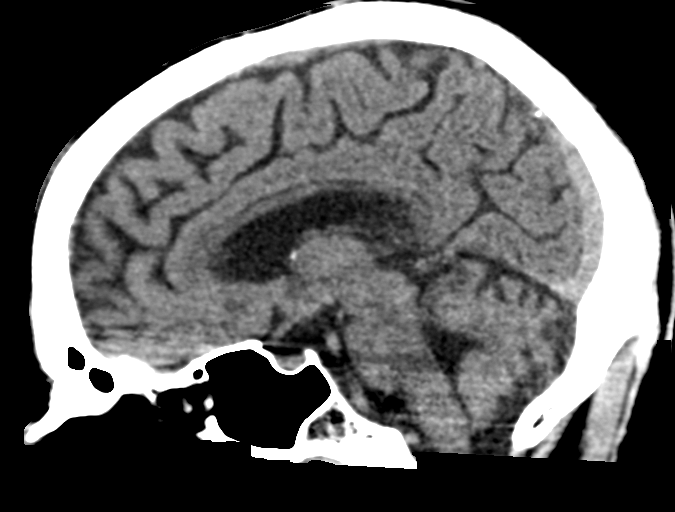
[im 31/46  brain]
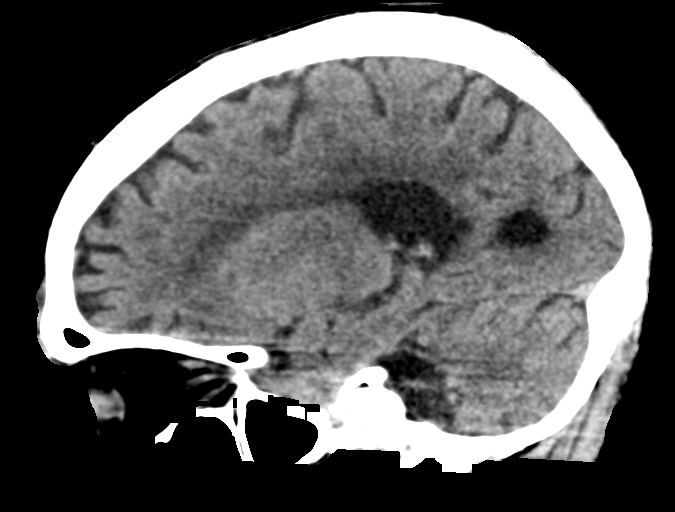

[15 of 46 positions shown; findings below may reference images not displayed]

FINDINGS: CT HEAD FINDINGS

Brain: There is no mass, hemorrhage or extra-axial collection. The
size and configuration of the ventricles and extra-axial CSF spaces
are normal. Encephalomalacia of the anterior left temporal lobe is
unchanged There is periventricular hypoattenuation compatible with
chronic microvascular disease.

Vascular: No hyperdense vessel or unexpected vascular calcification.

Skull: Mild left frontal scalp swelling.  No skull fracture.

CT MAXILLOFACIAL FINDINGS

Osseous:

--Complex facial fracture types: No LeFort, zygomaticomaxillary
complex or nasoorbitoethmoidal fracture.

--Simple fracture types: None.

--Mandible, hard palate and teeth: No acute abnormality.
Incidentally noted torus mandibularis. Multiple periapical
lucencies.

Orbits: The globes are intact. Normal appearance of the intra- and
extraconal fat. Symmetric extraocular muscles. There is a chronic
right lamina papyracea fracture.

Sinuses: No fluid levels or advanced mucosal thickening.

Soft tissues: Normal visualized extracranial soft tissues.
IMPRESSION: 1. Chronic ischemic microangiopathy without acute intracranial
abnormality.
2. Mild left frontal scalp swelling without skull fracture.
3. No facial fracture.

## 2021-03-17 IMAGING — CT CT MAXILLOFACIAL W/O CM
3 series · 15 of 47 positions shown, 18 images · non-contrast
Comparison: Head CT 11/10/2019

CLINICAL DATA: Fall with facial trauma

EXAM:
CT HEAD WITHOUT CONTRAST
CT MAXILLOFACIAL WITHOUT CONTRAST
TECHNIQUE: Multidetector CT imaging of the head and maxillofacial structures
were performed using the standard protocol without intravenous
contrast. Multiplanar CT image reconstructions of the maxillofacial
structures were also generated.

[Series 2: max soft · axial · 0.33mm/px · z∈[-254,-112]mm · 9 of 83 slices shown, 12 images]
[im 6/83  brain]
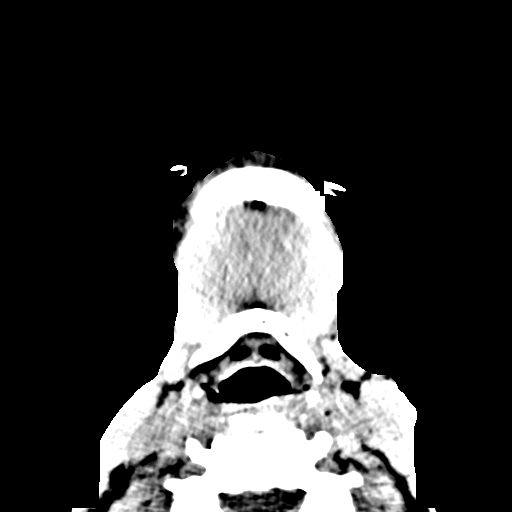
[im 6/83  bone]
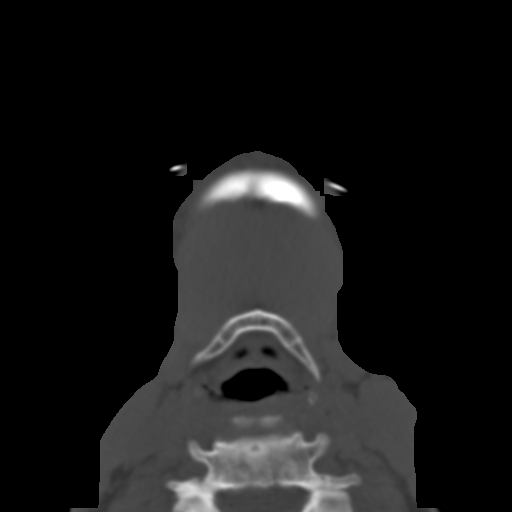
[im 15/83  bone]
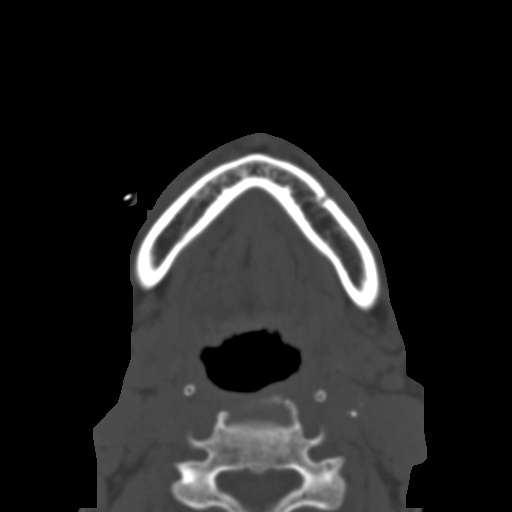
[im 23/83  bone]
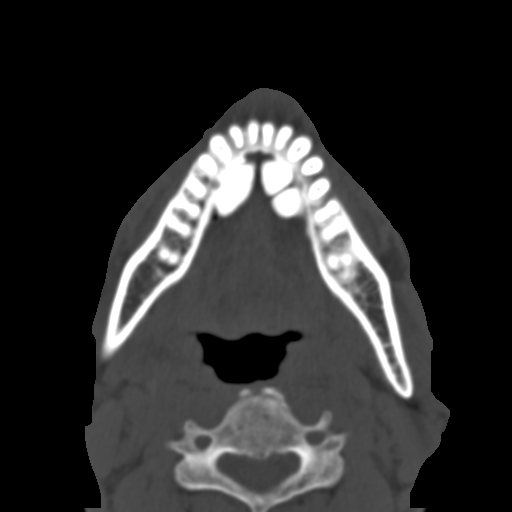
[im 32/83  bone]
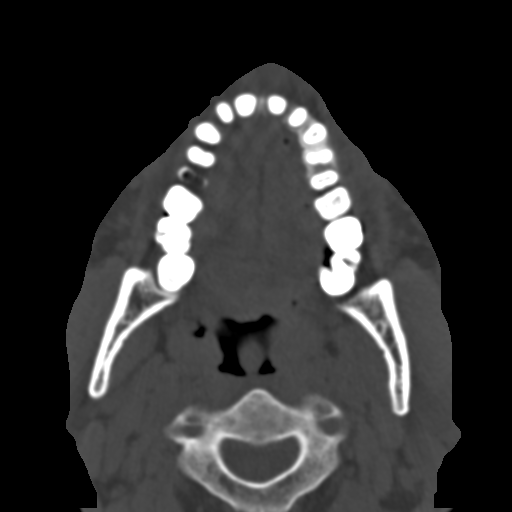
[im 43/83  brain]
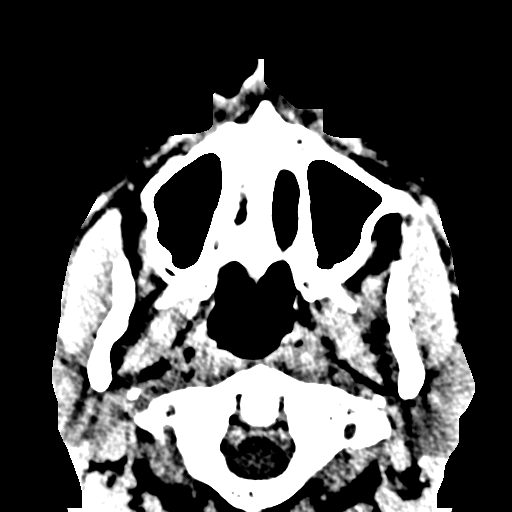
[im 43/83  bone]
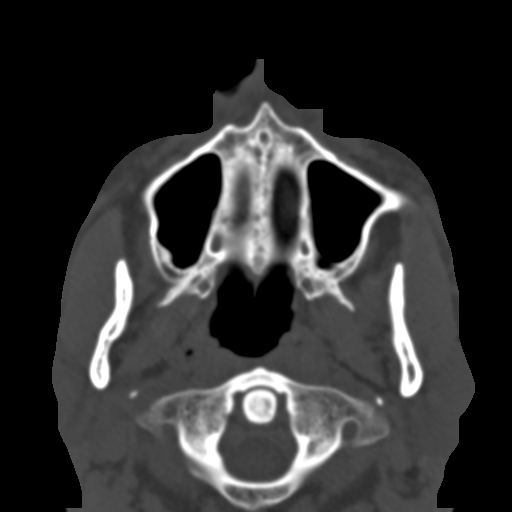
[im 51/83  bone]
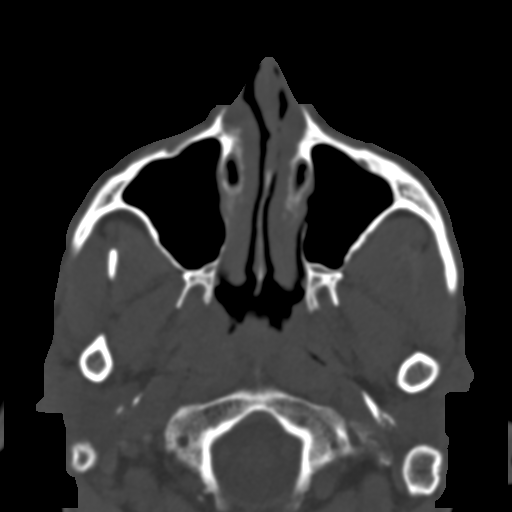
[im 60/83  bone]
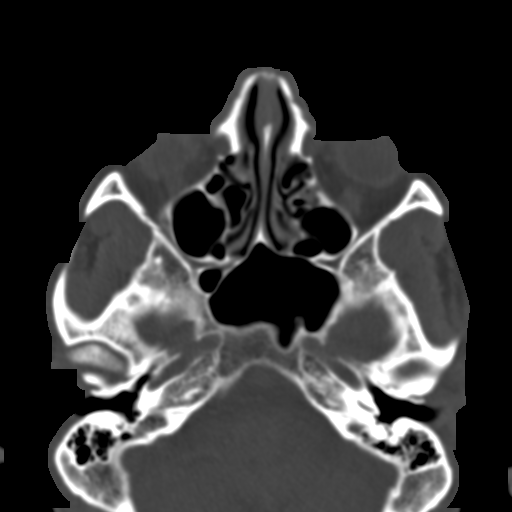
[im 68/83  bone]
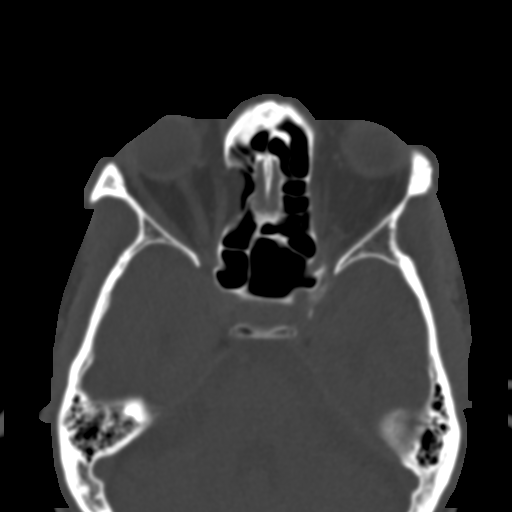
[im 77/83  brain]
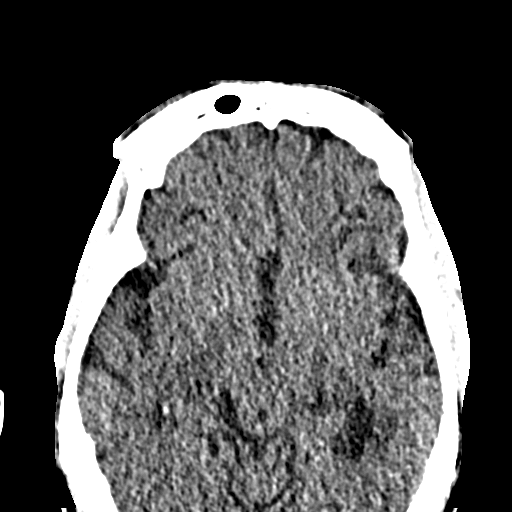
[im 77/83  bone]
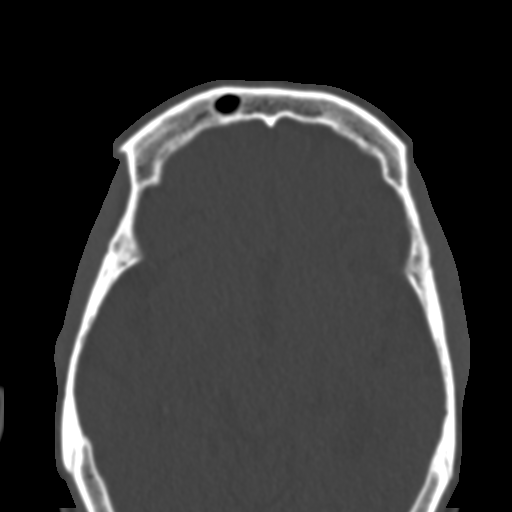

[Series 6: coronal soft · coronal · 0.31mm/px · 3 of 65 slices shown]
[im 22/65  bone]
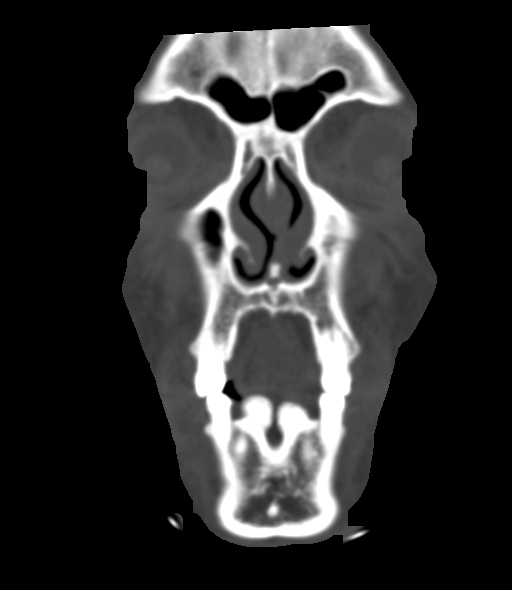
[im 29/65  bone]
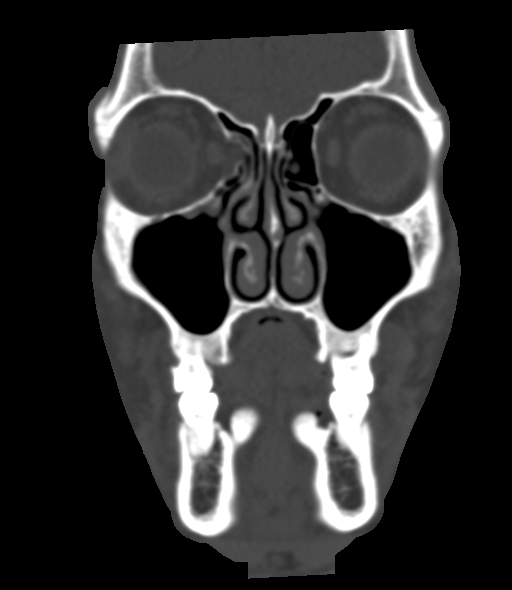
[im 36/65  bone]
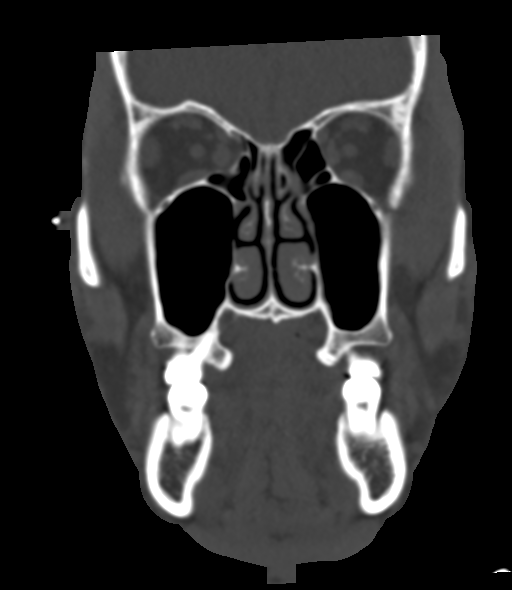

[Series 7: sagittal soft · sagittal · 0.33mm/px · 3 of 75 slices shown]
[im 25/75  bone]
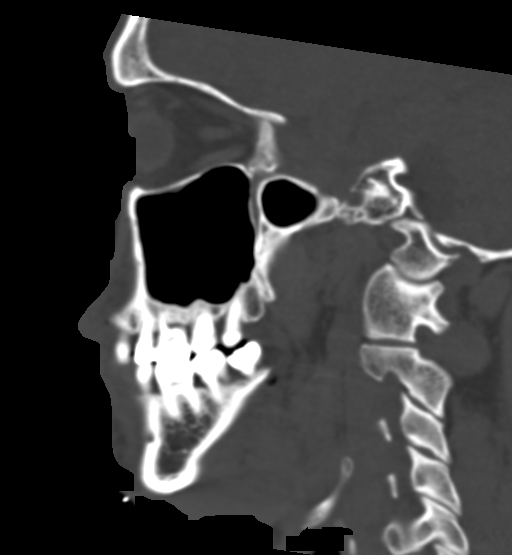
[im 38/75  bone]
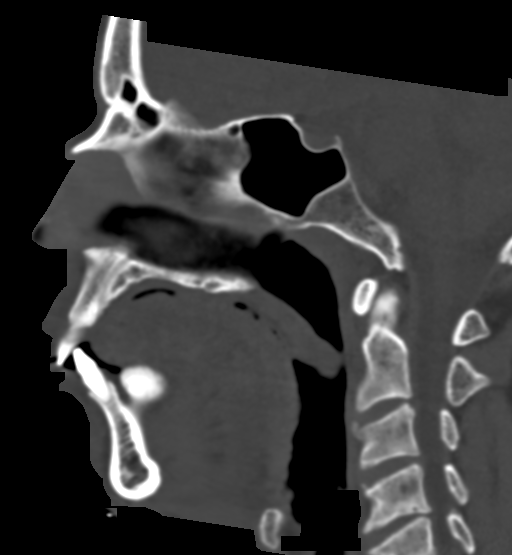
[im 50/75  bone]
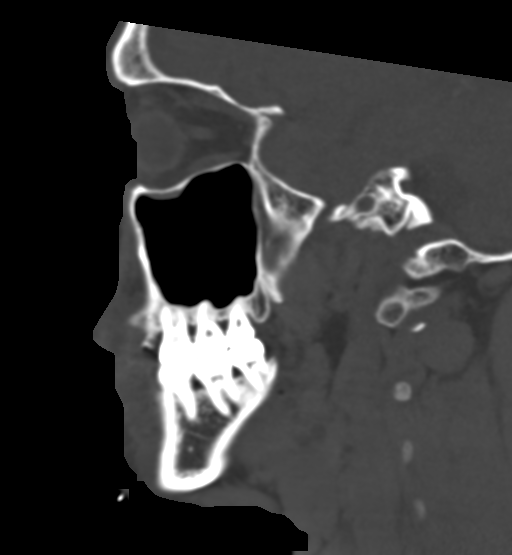

[15 of 47 positions shown; findings below may reference images not displayed]

FINDINGS: CT HEAD FINDINGS

Brain: There is no mass, hemorrhage or extra-axial collection. The
size and configuration of the ventricles and extra-axial CSF spaces
are normal. Encephalomalacia of the anterior left temporal lobe is
unchanged There is periventricular hypoattenuation compatible with
chronic microvascular disease.

Vascular: No hyperdense vessel or unexpected vascular calcification.

Skull: Mild left frontal scalp swelling.  No skull fracture.

CT MAXILLOFACIAL FINDINGS

Osseous:

--Complex facial fracture types: No LeFort, zygomaticomaxillary
complex or nasoorbitoethmoidal fracture.

--Simple fracture types: None.

--Mandible, hard palate and teeth: No acute abnormality.
Incidentally noted torus mandibularis. Multiple periapical
lucencies.

Orbits: The globes are intact. Normal appearance of the intra- and
extraconal fat. Symmetric extraocular muscles. There is a chronic
right lamina papyracea fracture.

Sinuses: No fluid levels or advanced mucosal thickening.

Soft tissues: Normal visualized extracranial soft tissues.
IMPRESSION: 1. Chronic ischemic microangiopathy without acute intracranial
abnormality.
2. Mild left frontal scalp swelling without skull fracture.
3. No facial fracture.

## 2024-01-27 ENCOUNTER — Ambulatory Visit: Payer: Medicaid Other | Admitting: Gastroenterology

## 2024-03-19 ENCOUNTER — Ambulatory Visit: Admitting: Gastroenterology

## 2024-04-09 ENCOUNTER — Encounter: Payer: Self-pay | Admitting: Physician Assistant

## 2024-04-14 ENCOUNTER — Encounter: Payer: Self-pay | Admitting: Physician Assistant

## 2024-05-17 NOTE — Progress Notes (Unsigned)
 Cameron Console, PA-C [address] [place], [place]  [address] Phone: [phone]   Gastroenterology Consultation  Referring Provider:     [facility] Primary Care Physician:  [facility] Primary Gastroenterologist:  Cameron Console, PA-C / Cameron Naval, MD  Reason for Consultation:     Discuss colonoscopy, GERD        HPI:   Cameron Hunt is a 63 y.o. y/o male referred for consultation & management  by [facility].  New patient.  Referred to schedule first screening colonoscopy due to age.  Patient has never had a colonoscopy or GI evaluation.  Patient is here with his social worker Cameron Hunt).  Legal guardian is Psychologist, forensic (Systems developer) - not present today.  We are using sign language interpreter [facility]) with [facility], who is here with patient today.  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  Lives in a group home.  Takes 81 mg aspirin daily.  No other blood thinners.  Receives home health care through [facility].  No seizure activity reported on Keppra .  Last reported seizure from alcohol withdrawal was in 2021.  Current symptoms: Patient denies any GI symptoms such as abdominal pain, heartburn, dysphagia, diarrhea, constipation, or rectal bleeding.  He is very reluctant to schedule a colonoscopy.  Lengthy 20 minute discussion explaining colonoscopy procedure, prep, risks, benefits, and colon cancer screening guidelines.  Multiple questions were answered.  Also discussed Cologuard stool test as a colon cancer screening.  Patient is adamantly refusing any tests today.  Past Medical History:  Diagnosis Date   Deaf    ETOH abuse    Hypertension     Past Surgical History:  Procedure Laterality Date   ABDOMINAL SURGERY       Prior to Admission medications   Medication Sig Start Date End Date Taking? Authorizing Provider  amLODipine  (NORVASC ) 5 MG tablet Take 1 tablet (5 mg total) by mouth daily. Patient not taking: Reported on 04/15/2020 09/30/19   Samtani, Jai-Gurmukh, MD  fluconazole  (DIFLUCAN ) 150 MG tablet Take 1 tablet (150 mg total) by mouth once a week. Patient not taking: Reported on 02/26/2019 11/28/18   Cuthriell, Dorn BIRCH, PA-C  meloxicam  (MOBIC ) 15 MG tablet Take 1 tablet (15 mg total) by mouth daily. Patient not taking: Reported on 04/15/2020 11/28/18   Cuthriell, Jonathan D, PA-C  pantoprazole  (PROTONIX ) 40 MG tablet Take 1 tablet (40 mg total) by mouth at bedtime. Patient not taking: Reported on 04/15/2020 09/30/19   Samtani, Jai-Gurmukh, MD    Family History  Family history unknown: Yes     Social History   Tobacco Use   Smoking status: Some Days    Types: Cigarettes   Smokeless tobacco: Never  Substance Use Topics   Alcohol use: Yes    Comment: ETOH Dependence    Drug use: Not Currently    Allergies as of 05/18/2024   (No Known Allergies)    Review of Systems:    All systems reviewed and negative except where noted in HPI.   Physical Exam:  BP 130/72   Pulse 84   Ht 6' 2 (1.88 m)   Wt 160 lb (72.6 kg)   BMI 20.54 kg/m  No LMP for male patient.  General:   Alert,  Well-developed, well-nourished, pleasant and cooperative in NAD Mouth: Poor dentition Ears: Hearing impaired.  Communicates with sign  language. Lungs:  Respirations even and unlabored.  Clear throughout to auscultation.   No wheezes, crackles, or rhonchi. No acute distress. Heart:  Regular rate and rhythm; no murmurs, clicks, rubs, or gallops. Abdomen:  Normal bowel sounds.  No bruits.  Soft, and non-distended without masses, hepatosplenomegaly or hernias noted.  No Tenderness.  No guarding or rebound tenderness.    Neurologic:  Alert and oriented x3;  grossly normal neurologically.  Some cognitive  impairment noted.  Patient answers questions appropriately and expresses understanding.  No physical limitations. Psych:  Alert and cooperative. Normal mood and affect.  Imaging Studies: No results found.  Labs: CBC    Component Value Date/Time   WBC 16.2 (H) 04/15/2020 1608   RBC 4.84 04/15/2020 1608   HGB 15.5 04/15/2020 1608   HGB 15.3 06/09/2012 0054   HCT 46.9 04/15/2020 1608   HCT 45.7 06/09/2012 0054   PLT 280 04/15/2020 1608   PLT 330 06/09/2012 0054   MCV 96.9 04/15/2020 1608   MCV 88 06/09/2012 0054   MCH 32.0 04/15/2020 1608   MCHC 33.0 04/15/2020 1608   RDW 15.0 04/15/2020 1608   RDW 16.1 (H) 06/09/2012 0054   LYMPHSABS 2.9 04/15/2020 1608   MONOABS 1.0 04/15/2020 1608   EOSABS 0.1 04/15/2020 1608   BASOSABS 0.2 (H) 04/15/2020 1608    CMP     Component Value Date/Time   NA 137 04/15/2020 1758   NA 141 06/09/2012 0054   K 3.5 04/15/2020 1758   K 3.9 06/09/2012 0054   CL 104 04/15/2020 1758   CL 103 06/09/2012 0054   CO2 22 04/15/2020 1758   CO2 28 06/09/2012 0054   GLUCOSE 146 (H) 04/15/2020 1758   GLUCOSE 126 (H) 06/09/2012 0054   BUN 10 04/15/2020 1758   BUN 9 06/09/2012 0054   CREATININE 0.80 04/15/2020 1758   CREATININE 0.71 06/09/2012 0054   CALCIUM 8.0 (L) 04/15/2020 1758   CALCIUM 9.4 06/09/2012 0054   PROT 7.3 04/15/2020 1758   PROT 8.8 (H) 06/09/2012 0054   ALBUMIN 3.9 04/15/2020 1758   ALBUMIN 4.3 06/09/2012 0054   AST 50 (H) 04/15/2020 1758   AST 30 06/09/2012 0054   ALT 44 04/15/2020 1758   ALT 27 06/09/2012 0054   ALKPHOS 94 04/15/2020 1758   ALKPHOS 139 (H) 06/09/2012 0054   BILITOT 0.9 04/15/2020 1758   BILITOT 0.7 06/09/2012 0054   GFRNONAA >60 04/15/2020 1758   GFRNONAA >60 06/09/2012 0054   GFRAA >60 04/15/2020 1758   GFRAA >60 06/09/2012 0054    Assessment and Plan:   Cameron Hunt is a 63 y.o. y/o male has been referred for:   Colon Cancer Screening - I discussed colon cancer screening options with patient at  length.  Risks and benefits of Cologuard versus traditional colonoscopy were discussed.  Colon cancer screening guidelines were discussed at length.  20-minute discussion with patient with detailed explanation and answering multiple questions.   - Patient adamantly refuses traditional colonoscopy and Cologuard test.  2.  GERD -controlled on current treatment -Continue pantoprazole  40 Mg 1 tablet once daily. -Avoid GERD trigger foods and drinks.  Follow up as needed if he develops any GI symptoms.  We are happy to see him back if he changes his mind about scheduling traditional screening colonoscopy or Cologuard test.  Total time spent: I personally spent a total of 61 minutes in the care of the patient today including preparing to see the patient, getting/reviewing separately obtained history, performing  a medically appropriate exam/evaluation, counseling and educating, documenting clinical information in the EHR, and coordinating care.   Cameron Console, PA-C

## 2024-05-18 ENCOUNTER — Ambulatory Visit (INDEPENDENT_AMBULATORY_CARE_PROVIDER_SITE_OTHER): Admitting: Physician Assistant

## 2024-05-18 ENCOUNTER — Encounter: Payer: Self-pay | Admitting: Physician Assistant

## 2024-05-18 VITALS — BP 130/72 | HR 84 | Ht 74.0 in | Wt 160.0 lb

## 2024-05-18 DIAGNOSIS — K219 Gastro-esophageal reflux disease without esophagitis: Secondary | ICD-10-CM

## 2024-05-18 DIAGNOSIS — Z1211 Encounter for screening for malignant neoplasm of colon: Secondary | ICD-10-CM

## 2024-05-18 NOTE — Progress Notes (Signed)
 Agree with assessment and plan as outlined.

## 2024-05-18 NOTE — Patient Instructions (Addendum)
 Colon Cancer Screening: - I discussed colon cancer screening options with patient at length.  Risks and benefits of Cologuard versus traditional colonoscopy were discussed.  Patient declined Cologuard test.  Patient declined traditional colonoscopy.   - Please let us  know if patient changes his mind.  We are happy to schedule Cologuard stool test (done every 3 years) or traditional Colonoscopy (done every 10 years) for Colon Cancer Screening.  Ellouise Console, PA-C    Please follow up sooner if symptoms increase or worsen  Due to recent changes in healthcare laws, you may see the results of your imaging and laboratory studies on MyChart before your provider has had a chance to review them.  We understand that in some cases there may be results that are confusing or concerning to you. Not all laboratory results come back in the same time frame and the provider may be waiting for multiple results in order to interpret others.  Please give us  48 hours in order for your provider to thoroughly review all the results before contacting the office for clarification of your results.   Thank you for trusting me with your gastrointestinal care!   Ellouise Console, PA-C _______________________________________________________  If your blood pressure at your visit was 140/90 or greater, please contact your primary care physician to follow up on this.  _______________________________________________________  If you are age 35 or older, your body mass index should be between 23-30. Your Body mass index is 20.54 kg/m. If this is out of the aforementioned range listed, please consider follow up with your Primary Care Provider.  If you are age 69 or younger, your body mass index should be between 19-25. Your Body mass index is 20.54 kg/m. If this is out of the aformentioned range listed, please consider follow up with your Primary Care Provider.   ________________________________________________________  The  [facility] providers would like to encourage you to use MYCHART to communicate with providers for non-urgent requests or questions.  Due to long hold times on the telephone, sending your provider a message by [facility] may be a faster and more efficient way to get a response.  Please allow 48 business hours for a response.  Please remember that this is for non-urgent requests.  _______________________________________________________
